# Patient Record
Sex: Male | Born: 1970 | Race: White | Hispanic: No | Marital: Married | Smoking: Current every day smoker
Health system: Southern US, Community
[De-identification: ages and names within clinical notes are randomized; demographics above are authoritative.]

## PROBLEM LIST (undated history)

## (undated) DIAGNOSIS — M1990 Unspecified osteoarthritis, unspecified site: Secondary | ICD-10-CM

## (undated) HISTORY — PX: WISDOM TOOTH EXTRACTION: SHX21

---

## 1994-01-30 HISTORY — PX: FRACTURE SURGERY: SHX138

## 2001-01-30 HISTORY — PX: OTHER SURGICAL HISTORY: SHX169

## 2002-01-30 HISTORY — PX: ABDOMINAL SURGERY: SHX537

## 2008-01-31 HISTORY — PX: POLYPECTOMY: SHX149

## 2015-03-05 ENCOUNTER — Encounter (HOSPITAL_COMMUNITY): Payer: Self-pay | Admitting: Emergency Medicine

## 2015-03-05 ENCOUNTER — Emergency Department (HOSPITAL_COMMUNITY)
Admission: EM | Admit: 2015-03-05 | Discharge: 2015-03-05 | Disposition: A | Discharge status: Home / Self Care | Payer: Self-pay | Attending: Emergency Medicine | Admitting: Emergency Medicine

## 2015-03-05 DIAGNOSIS — L03115 Cellulitis of right lower limb: Secondary | ICD-10-CM | POA: Insufficient documentation

## 2015-03-05 DIAGNOSIS — L02415 Cutaneous abscess of right lower limb: Secondary | ICD-10-CM | POA: Insufficient documentation

## 2015-03-05 MED ORDER — LIDOCAINE-EPINEPHRINE (PF) 1 %-1:200000 IJ SOLN
20.0000 mL | Freq: Once | INTRAMUSCULAR | Status: AC
  Administered 2015-03-05: 20 mL via INTRADERMAL
  Filled 2015-03-05: qty 20

## 2015-03-05 MED ORDER — CEPHALEXIN 500 MG PO CAPS: ORAL_CAPSULE | ORAL | Status: DC

## 2015-03-05 MED ORDER — HYDROCODONE-ACETAMINOPHEN 5-325 MG PO TABS: 1.0000 | ORAL_TABLET | Freq: Four times a day (QID) | ORAL | Status: DC | PRN

## 2015-03-05 MED ORDER — HYDROCODONE-ACETAMINOPHEN 5-325 MG PO TABS
1.0000 | ORAL_TABLET | Freq: Once | ORAL | Status: AC
  Administered 2015-03-05: 1 via ORAL
  Filled 2015-03-05: qty 1

## 2015-03-05 MED ORDER — SULFAMETHOXAZOLE-TRIMETHOPRIM 800-160 MG PO TABS: 1.0000 | ORAL_TABLET | Freq: Two times a day (BID) | ORAL | Status: AC

## 2015-03-05 NOTE — Discharge Instructions (Signed)
Continue to apply warm compress over the wound several times daily to help with drainage.  Remove packing in 2 days.  Take antibiotics as prescribed for the full duration.  Return to ER if you notice no improvement.  Avoid driving or operate heavy machinery while taking pain medication as it can cause drowsiness.  Abscess An abscess is an infected area that contains a collection of pus and debris.It can occur in almost any part of the body. An abscess is also known as a furuncle or boil. CAUSES  An abscess occurs when tissue gets infected. This can occur from blockage of oil or sweat glands, infection of hair follicles, or a minor injury to the skin. As the body tries to fight the infection, pus collects in the area and creates pressure under the skin. This pressure causes pain. People with weakened immune systems have difficulty fighting infections and get certain abscesses more often.  SYMPTOMS Usually an abscess develops on the skin and becomes a painful mass that is red, warm, and tender. If the abscess forms under the skin, you may feel a moveable soft area under the skin. Some abscesses break open (rupture) on their own, but most will continue to get worse without care. The infection can spread deeper into the body and eventually into the bloodstream, causing you to feel ill.  DIAGNOSIS  Your caregiver will take your medical history and perform a physical exam. A sample of fluid may also be taken from the abscess to determine what is causing your infection. TREATMENT  Your caregiver may prescribe antibiotic medicines to fight the infection. However, taking antibiotics alone usually does not cure an abscess. Your caregiver may need to make a small cut (incision) in the abscess to drain the pus. In some cases, gauze is packed into the abscess to reduce pain and to continue draining the area. HOME CARE INSTRUCTIONS   Only take over-the-counter or prescription medicines for pain, discomfort, or  fever as directed by your caregiver.  If you were prescribed antibiotics, take them as directed. Finish them even if you start to feel better.  If gauze is used, follow your caregiver's directions for changing the gauze.  To avoid spreading the infection:  Keep your draining abscess covered with a bandage.  Wash your hands well.  Do not share personal care items, towels, or whirlpools with others.  Avoid skin contact with others.  Keep your skin and clothes clean around the abscess.  Keep all follow-up appointments as directed by your caregiver. SEEK MEDICAL CARE IF:   You have increased pain, swelling, redness, fluid drainage, or bleeding.  You have muscle aches, chills, or a general ill feeling.  You have a fever. MAKE SURE YOU:   Understand these instructions.  Will watch your condition.  Will get help right away if you are not doing well or get worse.   This information is not intended to replace advice given to you by your health care provider. Make sure you discuss any questions you have with your health care provider.   Document Released: 10/26/2004 Document Revised: 07/18/2011 Document Reviewed: 03/31/2011 Elsevier Interactive Patient Education 2016 [employer]  Cellulitis Cellulitis is an infection of the skin and the tissue under the skin. The infected area is usually red and tender. This happens most often in the arms and lower legs. HOME CARE   Take your antibiotic medicine as told. Finish the medicine even if you start to feel better.  Keep the infected arm or  leg raised (elevated).  Put a warm cloth on the area up to 4 times per day.  Only take medicines as told by your doctor.  Keep all doctor visits as told. GET HELP IF:  You see red streaks on the skin coming from the infected area.  Your red area gets bigger or turns a dark color.  Your bone or joint under the infected area is painful after the skin heals.  Your infection comes back in  the same area or different area.  You have a puffy (swollen) bump in the infected area.  You have new symptoms.  You have a fever. GET HELP RIGHT AWAY IF:   You feel very sleepy.  You throw up (vomit) or have watery poop (diarrhea).  You feel sick and have muscle aches and pains.   This information is not intended to replace advice given to you by your health care provider. Make sure you discuss any questions you have with your health care provider.   Document Released: 07/05/2007 Document Revised: 10/07/2014 Document Reviewed: 04/03/2011 Elsevier Interactive Patient Education [employer]

## 2015-03-05 NOTE — ED Notes (Signed)
Pt reports was bitten by something on Tuesday. Pt reports intermittent fevers. Moderate red raised area noted to posterior right calf. Moderate redness noted around site. Black discoloration to top of site. Pt denies any n/v/chills.

## 2015-03-05 NOTE — ED Provider Notes (Signed)
CSN: [id number]     Arrival date & time 03/05/15  1348 History   None    Chief Complaint  Patient presents with  . Insect Bite     (Consider location/radiation/quality/duration/timing/severity/associated sxs/prior Treatment) HPI   45 year old male who presents for evaluation of a possible insect bite to R posterior calf.  Patient report 4 days ago he was walking his dog through the woods.  That night while showering he noticed a small sore area in the back of his right posterior calf. He did attempt to squeeze on it in the next day he noticed increasing pain and swelling. Pain has been persistent along with swelling and redness. Describe pain as a sharp aching sensation worsening with movement and palpation. He has tried taking over-the-counter pain medication at home with minimal relief. He is up-to-date with tetanus. He denies having any fever or numbness.  History reviewed. No pertinent past medical history. History reviewed. No pertinent past surgical history. History reviewed. No pertinent family history. Social History  Substance Use Topics  . Smoking status: Never Smoker   . Smokeless tobacco: None  . Alcohol Use: No    Review of Systems  Constitutional: Negative for fever.  Skin: Positive for rash.      Allergies  Almond (diagnostic) and Valium  Home Medications   Prior to Admission medications   Not on File   BP 132/62 mmHg  Pulse 74  Temp(Src) 98.1 F (36.7 C) (Oral)  Resp 18  Ht  (1.803 m)  Wt 81.647 kg  BMI 25.12 kg/m2  SpO2 98% Physical Exam  Constitutional: He appears well-developed and well-nourished. No distress.  HENT:  Head: Atraumatic.  Eyes: Conjunctivae are normal.  Neck: Neck supple.  Neurological: He is alert.  Skin:  Right posterior calf: An area of induration with fluctuance and surrounding skin erythema approximately 3 cm in diameter, exquisitely tender to palpation with small scab and small amount of necrotic tissue on top. No  lymphangitis. No joint involvement.  Psychiatric: He has a normal mood and affect.  Nursing note and vitals reviewed.   ED Course  Procedures (including critical care time)  INCISION AND DRAINAGE Performed by: Fayrene Helper Consent: Verbal consent obtained. Risks and benefits: risks, benefits and alternatives were discussed Type: abscess  Body area: R posterior calf  Anesthesia: local infiltration  Incision was made with a scalpel.  Local anesthetic: lidocaine 2% w epinephrine  Anesthetic total: 5 ml  Complexity: complex Blunt dissection to break up loculations  Drainage: purulent  Drainage amount: small  Packing material: 1/4 in iodoform gauze  Patient tolerance: Patient tolerated the procedure well with no immediate complications.     MDM   Final diagnoses:  Abscess of right leg  Cellulitis of right leg    BP 132/62 mmHg  Pulse 74  Temp(Src) 98.1 F (36.7 C) (Oral)  Resp 18  Ht  (1.803 m)  Wt 81.647 kg  BMI 25.12 kg/m2  SpO2 98%   3:35 PM patient presents with a possible insect bite forming an abscess to his right posterior calf amenable for incision and drainage. He does have cellulitic skin changes as well and he will need antibiotic. He is up-to-date with tetanus. Pain medication given.  4:00 PM Successful incision and drainage with moderate pustular discharge. Packing is placed. I discussed patient wound care including warm compress, taking antibiotic for the full duration, taking pain medication but avoid driving and to return if symptoms worsen. Patient voiced understanding and agrees with  plan.   Fayrene Helper, PA-C 03/05/15 1600  Samuel Jester, DO 03/07/15 2119

## 2015-04-27 ENCOUNTER — Emergency Department (HOSPITAL_COMMUNITY): Payer: Self-pay

## 2015-04-27 ENCOUNTER — Encounter (HOSPITAL_COMMUNITY): Payer: Self-pay | Admitting: Emergency Medicine

## 2015-04-27 ENCOUNTER — Emergency Department (HOSPITAL_COMMUNITY)
Admission: EM | Admit: 2015-04-27 | Discharge: 2015-04-27 | Disposition: A | Discharge status: Home / Self Care | Payer: Self-pay | Attending: Emergency Medicine | Admitting: Emergency Medicine

## 2015-04-27 DIAGNOSIS — S43402A Unspecified sprain of left shoulder joint, initial encounter: Secondary | ICD-10-CM | POA: Insufficient documentation

## 2015-04-27 DIAGNOSIS — Y999 Unspecified external cause status: Secondary | ICD-10-CM | POA: Insufficient documentation

## 2015-04-27 DIAGNOSIS — Y929 Unspecified place or not applicable: Secondary | ICD-10-CM | POA: Insufficient documentation

## 2015-04-27 DIAGNOSIS — Y939 Activity, unspecified: Secondary | ICD-10-CM | POA: Insufficient documentation

## 2015-04-27 DIAGNOSIS — W19XXXA Unspecified fall, initial encounter: Secondary | ICD-10-CM | POA: Insufficient documentation

## 2015-04-27 MED ORDER — OXYCODONE-ACETAMINOPHEN 5-325 MG PO TABS
1.0000 | ORAL_TABLET | Freq: Three times a day (TID) | ORAL | Status: DC | PRN
Start: 2015-04-27 — End: 2015-09-30

## 2015-04-27 MED ORDER — HYDROMORPHONE HCL 1 MG/ML IJ SOLN
1.0000 mg | Freq: Once | INTRAMUSCULAR | Status: AC
  Administered 2015-04-27: 1 mg via INTRAVENOUS
  Filled 2015-04-27: qty 1

## 2015-04-27 MED ORDER — IBUPROFEN 600 MG PO TABS: 600.0000 mg | ORAL_TABLET | Freq: Four times a day (QID) | ORAL | Status: DC | PRN

## 2015-04-27 NOTE — ED Provider Notes (Signed)
CSN: [id number]     Arrival date & time 04/27/15  1117 History   First MD Initiated Contact with Patient 04/27/15 1206     Chief Complaint  Patient presents with  . Shoulder Injury      Patient is a 45 y.o. male presenting with shoulder injury. The history is provided by the patient.  Shoulder Injury This is a new problem. Episode onset: just prior to arrival. The problem occurs constantly. The problem has been gradually worsening. Pertinent negatives include no chest pain, no abdominal pain and no headaches. Exacerbated by: movement. The symptoms are relieved by rest.  pt reports he tripped and fell and landed on left shoulder and he thinks he dislocated the shoulder He reports long h/o shoulder issues including previous dislocations and he has had surgery on left clavicle previously No head injury No LOC No neck pain No other pain complaints    PMH -none surg hx- left clavicle surgery Social History  Substance Use Topics  . Smoking status: Never Smoker   . Smokeless tobacco: None  . Alcohol Use: No    Review of Systems  Constitutional: Negative for fever.  Cardiovascular: Negative for chest pain.  Gastrointestinal: Negative for vomiting and abdominal pain.  Musculoskeletal: Positive for arthralgias.  Neurological: Negative for headaches.  All other systems reviewed and are negative.     Allergies  Almond (diagnostic) and Valium  Home Medications   Prior to Admission medications   Not on File   BP 151/79 mmHg  Pulse 73  Temp(Src) 98.8 F (37.1 C) (Oral)  Resp 17  Ht  (1.803 m)  Wt 83.915 kg  BMI 25.81 kg/m2  SpO2 100% Physical Exam CONSTITUTIONAL: Well developed/well nourished HEAD: Normocephalic/atraumatic EYES: EOMI ENMT: Mucous membranes moist NECK: supple no meningeal signs SPINE/BACK:entire spine nontender CV: S1/S2 noted, no murmurs/rubs/gallops noted LUNGS: Lungs are clear to auscultation bilaterally, no apparent distress ABDOMEN: soft,  nontender NEURO: Pt is awake/alert/appropriate, moves all extremitiesx4.  No facial droop.   EXTREMITIES: pulses normal/equal, full ROM, tenderness to left shoulder but no obvious deformity.  Swelling noted to left clavicle but no significant skin tenting, no lacerations/abrasions noted No other tenderness to left wrist/hand.   SKIN: warm, color normal PSYCH: no abnormalities of mood noted, alert and oriented to situation  ED Course  Procedures  SPLINT APPLICATION Date/Time: 1:49 PM Authorized by: Joya Gaskins Consent: Verbal consent obtained. Risks and benefits: risks, benefits and alternatives were discussed Consent given by: patient Splint applied by: nurse Location details: left arm Splint type: sling Supplies used: sling Post-procedure: The splinted body part was neurovascularly unchanged following the procedure. Patient tolerance: Patient tolerated the procedure well with no immediate complications.    Imaging Review Dg Shoulder Left  04/27/2015  CLINICAL DATA:  Left shoulder pain after fall the or. Initial encounter. EXAM: LEFT SHOULDER - 2+ VIEW COMPARISON:  None. FINDINGS: Remote distal clavicle fracture versus acromioclavicular separation status post repair. The screw with washer has minimal to no purchase in the scapula. There is a neighboring soft tissue anchor which also appears displaced. Advanced glenohumeral osteoarthritis with bulky inferior spurring. No axillary view was obtained to assess narrowing. No glenohumeral dislocation or fracture. Anterior glenohumeral 2 cm articular body. IMPRESSION: 1. Failed repair of remote distal clavicle fracture/acromioclavicular separation. 2. Advanced glenohumeral osteoarthritis. Electronically Signed   By: Marnee Spring M.D.   On: 04/27/2015 13:04   I have personally reviewed and evaluated these images results as part of my medical decision-making.  1:49 PM I discussed xray imaging with radiology dr watts No acute  fx/dislocation and it appears hardware from previous surgery is not acutely injured Pt reports he last [place]rs [place] 2:47 PM I attempted to arrange ortho f/u, but I was never able to contact orthopedics and pt insisted on leaving due to childcare issues Will go home in arm sling, short course of pain meds and f/u with ortho and he will call tomorrow  Medications  HYDROmorphone (DILAUDID) injection 1 mg (1 mg Intravenous Given 04/27/15 1247)  HYDROmorphone (DILAUDID) injection 1 mg (1 mg Intravenous Given 04/27/15 1409)     MDM   Final diagnoses:  Shoulder sprain, left, initial encounter    Nursing notes including past medical history and social history reviewed and considered in documentation xrays/imaging reviewed by myself and considered during evaluation Narcotic database reviewed and considered in decision making     Zadie Rhineonald Price [place], MD 04/27/15 1448

## 2015-04-27 NOTE — ED Notes (Signed)
Patient with no complaints at this time. Respirations even and unlabored. Skin warm/dry. Discharge instructions reviewed with patient at this time. Patient given opportunity to voice concerns/ask questions. Patient discharged at this time and left Emergency Department with steady gait.   

## 2015-04-27 NOTE — Discharge Instructions (Signed)
Shoulder Sprain °A shoulder sprain is a partial or complete tear in one of the tough, fiber-like tissues (ligaments) in the shoulder. The ligaments in the shoulder help to hold the shoulder in place. °CAUSES °This condition may be caused by: °· A fall. °· A hit to the shoulder. °· A twist of the arm. °RISK FACTORS °This condition is more likely to develop in: °· People who play sports. °· People who have problems with balance or coordination. °SYMPTOMS °Symptoms of this condition include: °· Pain when moving the shoulder. °· Limited ability to move the shoulder. °· Swelling and tenderness on top of the shoulder. °· Warmth in the shoulder. °· A change in the shape of the shoulder. °· Redness or bruising on the shoulder. °DIAGNOSIS °This condition is diagnosed with a physical exam. During the exam, you may be asked to do simple exercises with your shoulder. You may also have imaging tests, such as X-rays, MRI, or a CT scan. These tests can show how severe the sprain is. °TREATMENT °This condition may be treated with: °· Rest. °· Pain medicine. °· Ice. °· A sling or brace. This is used to keep the arm still while the shoulder is healing. °· Physical therapy or rehabilitation exercises. These help to improve the range of motion and strength of the shoulder. °· Surgery (rare). Surgery may be needed if the sprain caused a joint to become unstable. Surgery may also be needed to reduce pain. °Some people may develop ongoing shoulder pain or lose some range of motion in the shoulder. However, most people do not develop long-term problems. °HOME CARE INSTRUCTIONS °· Rest. °· Take over-the-counter and prescription medicines only as told by your health care provider. °· If directed, apply ice to the area: °¨ Put ice in a plastic bag. °¨ Place a towel between your skin and the bag. °¨ Leave the ice on for 20 minutes, 2-3 times per day. °· If you were given a shoulder sling or brace: °¨ Wear it as told. °¨ Remove it to shower or  bathe. °¨ Move your arm only as much as told by your health care provider, but keep your hand moving to prevent swelling. °· If you were shown how to do any exercises, do them as told by your health care provider. °· Keep all follow-up visits as told by your health care provider. This is important. °SEEK MEDICAL CARE IF: °· Your pain gets worse. °· Your pain is not relieved with medicines. °· You have increased redness or swelling. °SEEK IMMEDIATE MEDICAL CARE IF: °· You have a fever. °· You cannot move your arm or shoulder. °· You develop numbness or tingling in your arms, hands, or fingers. °  °This information is not intended to replace advice given to you by your health care provider. Make sure you discuss any questions you have with your health care provider. °  °Document Released: 06/04/2008 Document Revised: 10/07/2014 Document Reviewed: 05/11/2014 °Elsevier Interactive Patient Education ©2016 [employer] Inc[employer] ° °

## 2015-04-27 NOTE — ED Notes (Signed)
Dislocated left shoulder after trying to break a fall.  Rates pain 10/10.

## 2015-06-27 ENCOUNTER — Emergency Department (HOSPITAL_COMMUNITY)
Admission: EM | Admit: 2015-06-27 | Discharge: 2015-06-27 | Disposition: A | Discharge status: Home / Self Care | Payer: Self-pay | Attending: Emergency Medicine | Admitting: Emergency Medicine

## 2015-06-27 ENCOUNTER — Emergency Department (HOSPITAL_COMMUNITY): Payer: Self-pay

## 2015-06-27 ENCOUNTER — Encounter (HOSPITAL_COMMUNITY): Payer: Self-pay | Admitting: Emergency Medicine

## 2015-06-27 DIAGNOSIS — R51 Headache: Secondary | ICD-10-CM | POA: Insufficient documentation

## 2015-06-27 DIAGNOSIS — Y9234 Swimming pool (public) as the place of occurrence of the external cause: Secondary | ICD-10-CM | POA: Insufficient documentation

## 2015-06-27 DIAGNOSIS — W16522A Jumping or diving into swimming pool striking bottom causing other injury, initial encounter: Secondary | ICD-10-CM | POA: Insufficient documentation

## 2015-06-27 DIAGNOSIS — Y9315 Activity, underwater diving and snorkeling: Secondary | ICD-10-CM | POA: Insufficient documentation

## 2015-06-27 DIAGNOSIS — S0181XA Laceration without foreign body of other part of head, initial encounter: Secondary | ICD-10-CM | POA: Insufficient documentation

## 2015-06-27 DIAGNOSIS — Y999 Unspecified external cause status: Secondary | ICD-10-CM | POA: Insufficient documentation

## 2015-06-27 MED ORDER — HYDROCODONE-ACETAMINOPHEN 5-325 MG PO TABS: ORAL_TABLET | ORAL | Status: DC

## 2015-06-27 MED ORDER — LIDOCAINE HCL (PF) 2 % IJ SOLN
10.0000 mL | Freq: Once | INTRAMUSCULAR | Status: AC
  Administered 2015-06-27: 10 mL via INTRADERMAL
  Filled 2015-06-27: qty 10

## 2015-06-27 MED ORDER — OXYCODONE-ACETAMINOPHEN 5-325 MG PO TABS
1.0000 | ORAL_TABLET | Freq: Once | ORAL | Status: AC
  Administered 2015-06-27: 1 via ORAL
  Filled 2015-06-27: qty 1
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
## 2015-06-27 NOTE — Discharge Instructions (Signed)
Laceration Care, Adult  A laceration is a cut that goes through all layers of the skin. The cut also goes into the tissue that is right under the skin. Some cuts heal on their own. Others need to be closed with stitches (sutures), staples, skin adhesive strips, or wound glue. Taking care of your cut lowers your risk of infection and helps your cut to heal better.  HOW TO TAKE CARE OF YOUR CUT  For stitches or staples:  · Keep the wound clean and dry.  · If you were given a bandage (dressing), you should change it at least one time per day or as told by your doctor. You should also change it if it gets wet or dirty.  · Keep the wound completely dry for the first 24 hours or as told by your doctor. After that time, you may take a shower or a bath. However, make sure that the wound is not soaked in water until after the stitches or staples have been removed.  · Clean the wound one time each day or as told by your doctor:    Wash the wound with soap and water.    Rinse the wound with water until all of the soap comes off.    Pat the wound dry with a clean towel. Do not rub the wound.  · After you clean the wound, put a thin layer of antibiotic ointment on it as told by your doctor. This ointment:    Helps to prevent infection.    Keeps the bandage from sticking to the wound.  · Have your stitches or staples removed as told by your doctor.  If your doctor used skin adhesive strips:   · Keep the wound clean and dry.  · If you were given a bandage, you should change it at least one time per day or as told by your doctor. You should also change it if it gets dirty or wet.  · Do not get the skin adhesive strips wet. You can take a shower or a bath, but be careful to keep the wound dry.  · If the wound gets wet, pat it dry with a clean towel. Do not rub the wound.  · Skin adhesive strips fall off on their own. You can trim the strips as the wound heals. Do not remove any strips that are still stuck to the wound. They will  fall off after a while.  If your doctor used wound glue:  · Try to keep your wound dry, but you may briefly wet it in the shower or bath. Do not soak the wound in water, such as by swimming.  · After you take a shower or a bath, gently pat the wound dry with a clean towel. Do not rub the wound.  · Do not do any activities that will make you really sweaty until the skin glue has fallen off on its own.  · Do not apply liquid, cream, or ointment medicine to your wound while the skin glue is still on.  · If you were given a bandage, you should change it at least one time per day or as told by your doctor. You should also change it if it gets dirty or wet.  · If a bandage is placed over the wound, do not let the tape for the bandage touch the skin glue.  · Do not pick at the glue. The skin glue usually stays on for 5-10 days. Then, it   falls off of the skin.  General Instructions   · To help prevent scarring, make sure to cover your wound with sunscreen whenever you are outside after stitches are removed, after adhesive strips are removed, or when wound glue stays in place and the wound is healed. Make sure to wear a sunscreen of at least 30 SPF.  · Take over-the-counter and prescription medicines only as told by your doctor.  · If you were given antibiotic medicine or ointment, take or apply it as told by your doctor. Do not stop using the antibiotic even if your wound is getting better.  · Do not scratch or pick at the wound.  · Keep all follow-up visits as told by your doctor. This is important.  · Check your wound every day for signs of infection. Watch for:    Redness, swelling, or pain.    Fluid, blood, or pus.  · Raise (elevate) the injured area above the level of your heart while you are sitting or lying down, if possible.  GET HELP IF:  · You got a tetanus shot and you have any of these problems at the injection site:    Swelling.    Very bad pain.    Redness.    Bleeding.  · You have a fever.  · A wound that was  closed breaks open.  · You notice a bad smell coming from your wound or your bandage.  · You notice something coming out of the wound, such as wood or glass.  · Medicine does not help your pain.  · You have more redness, swelling, or pain at the site of your wound.  · You have fluid, blood, or pus coming from your wound.  · You notice a change in the color of your skin near your wound.  · You need to change the bandage often because fluid, blood, or pus is coming from the wound.  · You start to have a new rash.  · You start to have numbness around the wound.  GET HELP RIGHT AWAY IF:  · You have very bad swelling around the wound.  · Your pain suddenly gets worse and is very bad.  · You notice painful lumps near the wound or on skin that is anywhere on your body.  · You have a red streak going away from your wound.  · The wound is on your hand or foot and you cannot move a finger or toe like you usually can.  · The wound is on your hand or foot and you notice that your fingers or toes look pale or bluish.     This information is not intended to replace advice given to you by your health care provider. Make sure you discuss any questions you have with your health care provider.     Document Released: 07/05/2007 Document Revised: 06/02/2014 Document Reviewed: 01/12/2014  Elsevier Interactive Patient Education ©2016 [employer]
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
## 2015-07-20 ENCOUNTER — Emergency Department (HOSPITAL_COMMUNITY)
Admission: EM | Admit: 2015-07-20 | Discharge: 2015-07-20 | Disposition: A | Discharge status: Home / Self Care | Payer: Self-pay | Attending: Emergency Medicine | Admitting: Emergency Medicine

## 2015-07-20 ENCOUNTER — Encounter (HOSPITAL_COMMUNITY): Payer: Self-pay | Admitting: *Deleted

## 2015-07-20 DIAGNOSIS — Y990 Civilian activity done for income or pay: Secondary | ICD-10-CM | POA: Insufficient documentation

## 2015-07-20 DIAGNOSIS — Y929 Unspecified place or not applicable: Secondary | ICD-10-CM | POA: Insufficient documentation

## 2015-07-20 DIAGNOSIS — S86812A Strain of other muscle(s) and tendon(s) at lower leg level, left leg, initial encounter: Secondary | ICD-10-CM | POA: Insufficient documentation

## 2015-07-20 DIAGNOSIS — X58XXXA Exposure to other specified factors, initial encounter: Secondary | ICD-10-CM | POA: Insufficient documentation

## 2015-07-20 DIAGNOSIS — S86119A Strain of other muscle(s) and tendon(s) of posterior muscle group at lower leg level, unspecified leg, initial encounter: Secondary | ICD-10-CM

## 2015-07-20 DIAGNOSIS — Y939 Activity, unspecified: Secondary | ICD-10-CM | POA: Insufficient documentation

## 2015-07-20 MED ORDER — NAPROXEN 250 MG PO TABS
500.0000 mg | ORAL_TABLET | Freq: Once | ORAL | Status: AC
  Administered 2015-07-20: 500 mg via ORAL
  Filled 2015-07-20: qty 2

## 2015-07-20 MED ORDER — NAPROXEN 500 MG PO TABS: 500.0000 mg | ORAL_TABLET | Freq: Two times a day (BID) | ORAL | Status: DC

## 2015-07-20 NOTE — ED Notes (Signed)
Pt states sharp pain in left calf while lifting up onto his toes.

## 2015-07-20 NOTE — ED Provider Notes (Signed)
CSN: [id number]     Arrival date & time 07/20/15  1544 History  First MD Initiated Contact with Patient 07/20/15 1611     Chief Complaint  Patient presents with  . Leg Pain   HPI Patient presents to the emergency room for evaluation of sharp pain in his left  calf.  Patient states he was at work the other day when he went to stand up on his toes he immediately felt a sharp pain in the back of his calf. Patient was able to continue to walk however the pain has increased. It hurts to put any weight. The pain is very sharp. he's noticed bruising and swelling now in his ankle. He denies any numbness or weakness. No chest pain or shortness of breath.  History reviewed. No pertinent past medical history. History reviewed. No pertinent past surgical history. History reviewed. No pertinent family history. Social History  Substance Use Topics  . Smoking status: Never Smoker   . Smokeless tobacco: None  . Alcohol Use: No    Review of Systems  All other systems reviewed and are negative.     Allergies  Almond (diagnostic) and Valium  Home Medications   Prior to Admission medications   Medication Sig Start Date End Date Taking? Authorizing Provider  HYDROcodone-acetaminophen (NORCO/VICODIN) 5-325 MG tablet Take one-two tabs po q 4-6 hrs prn pain 06/27/15   Tammy Triplett, PA-C  ibuprofen (ADVIL,MOTRIN) 600 MG tablet Take 1 tablet (600 mg total) by mouth every 6 (six) hours as needed. 04/27/15   Zadie Rhineonald Wickline, MD  oxyCODONE-acetaminophen (PERCOCET/ROXICET) 5-325 MG tablet Take 1 tablet by mouth every 8 (eight) hours as needed for severe pain. 04/27/15   Zadie Rhineonald Wickline, MD   BP 122/62 mmHg  Pulse 79  Temp(Src) 98.9 F (37.2 C) (Oral)  Resp 16  Ht 5\' 10"  (1.778 m)  Wt 83.915 kg  BMI 26.54 kg/m2  SpO2 98% Physical Exam  Constitutional: He appears well-developed and well-nourished. No distress.  HENT:  Head: Normocephalic and atraumatic.  Right Ear: External ear normal.  Left Ear:  External ear normal.  Eyes: Conjunctivae are normal. Right eye exhibits no discharge. Left eye exhibits no discharge. No scleral icterus.  Neck: Neck supple. No tracheal deviation present.  Cardiovascular: Normal rate.   Pulmonary/Chest: Effort normal. No stridor. No respiratory distress.  Musculoskeletal: He exhibits edema and tenderness.       Left ankle: Achilles tendon normal. Achilles tendon exhibits no defect and normal Thompson's test results.       Right lower leg: Normal. He exhibits no bony tenderness, no deformity and no laceration.       Left lower leg: He exhibits tenderness and swelling. He exhibits no bony tenderness.  Edema and tenderness of the  Left calf , ecchymosis noted inferiorly around the ankle, no tenderness to palpation along the medial malleolus or lateral malleolus. No tenderness to palpation of the tibia  Patient is able to plantarflex and dorsiflex, negative Thompson test  Neurological: He is alert. Cranial nerve deficit: no gross deficits.  Skin: Skin is warm and dry. No rash noted.  Psychiatric: He has a normal mood and affect.  Nursing note and vitals reviewed.   ED Course  Procedures (including critical care time)   MDM   Final diagnoses:  Gastrocnemius strain, unspecified laterality, initial encounter   Patient's exam is consistent with a gastrocnemius strain and tear. I doubt Achilles rupture. Plan on crutches, nonweightbearing and outpatient follow-up with orthopedics.  Linwood Dibbles, MD 07/20/15 1650

## 2015-07-20 NOTE — Discharge Instructions (Signed)
Medial Head Gastrocnemius Tear With Rehab °Medial head gastrocnemius tear, also called tennis leg, is a tear (strain) in a muscle or tendon of the inner portion (medial head) of one of the calf muscles (gastrocnemius). The inner portion of the calf muscle attaches to the thigh bone (femur) and is responsible for bending the knee and straightening the foot (standing "on tiptoe"). Strains are classified into three categories. Grade 1 strains cause pain, but the tendon is not lengthened. Grade 2 strains include a lengthened ligament, due to the ligament being stretched or partially ruptured. With grade 2 strains there is still function, although function may be decreased. Grade 3 strains involve a complete tear of the tendon or muscle, and function is usually impaired. °SYMPTOMS  °· Sudden "pop" or tear felt at the time of injury. °· Pain, tenderness, swelling, warmth, or redness over the middle inner calf. °· Pain and weakness with ankle motion, especially flexing the ankle against resistance, as well as pain with lifting up the foot (extending the ankle). °· Bruising (contusion) of the calf, heel, and sometimes the foot within 48 hours of injury. °· Muscle spasm in the calf. °CAUSES  °Muscle and ligament strains occur when a force is placed on the muscle or ligament that is greater than it can handle. Common causes of injury include: °· Direct hit (trauma) to the calf. °· Sudden forceful pushing off or landing on the foot (jumping, landing, serving a tennis ball, lunging). °RISK INCREASES WITH: °· Sports that require sudden, explosive calf muscle contraction, such as those involving jumping (basketball), hill running, quick starts (running), or lunging (racquetball, tennis). °· Contact sports (football, soccer, hockey). °· Poor strength and flexibility. °· Previous lower limb injury. °PREVENTION °· Warm up and stretch properly before activity. °· Allow for adequate recovery between workouts. °· Maintain physical  fitness: °¨ Strength, flexibility, and endurance. °¨ Cardiovascular fitness. °· Learn and use proper exercise technique. °· Complete rehabilitation after lower limb injury, before returning to competition or practice. °PROGNOSIS  °If treated properly, tennis leg usually heals within 6 weeks of nonsurgical treatment.  °RELATED COMPLICATIONS  °· Longer healing time, if not properly treated or if not given enough time to heal. °· Recurring symptoms and injury, if activity is resumed too soon, with overuse, with a direct blow, or with poor technique. °· If untreated, may progress to a complete tear (rare) or other injury, due to limping and favoring of the injured leg. °· Persistent limping, due to scarring and shortening of the calf muscles, as a result of inadequate rehabilitation. °· Prolonged disability. °TREATMENT  °Treatment first involves the use of ice and medication to help reduce pain and inflammation. The use of strengthening and stretching exercises may help reduce pain with activity. These exercises may be performed at home or with a therapist. For severe injuries, referral to a therapist may be needed for further evaluation and treatment. Your caregiver may advise that you wear a brace to help healing. Sometimes, crutches are needed until you can walk without limping. Rarely, surgery is needed.  °MEDICATION  °· If pain medicine is needed, nonsteroidal anti-inflammatory medicines (aspirin and ibuprofen), or other minor pain relievers (acetaminophen), are often advised. °· Do not take pain medicine for 7 days before surgery. °· Prescription pain relievers may be given, if your caregiver thinks they are needed. Use only as directed and only as much as you need. °HEAT AND COLD °· Cold treatment (icing) should be applied for 10 to 15 minutes every   2 to 3 hours for inflammation and pain, and immediately after activity that aggravates your symptoms. Use ice packs or an ice massage. °· Heat treatment may be used  before performing stretching and strengthening activities prescribed by your caregiver, physical therapist, or athletic trainer. Use a heat pack or a warm water soak. °SEEK MEDICAL CARE IF:  °· Symptoms get worse or do not improve in 2 weeks, despite treatment. °· Numbness or tingling develops. °· New, unexplained symptoms develop. (Drugs used in treatment may produce side effects.) °EXERCISES  °RANGE OF MOTION (ROM) AND STRETCHING EXERCISES - Medial Head Gastrocnemius Tear (Tennis Leg) °These exercises may help you when beginning to rehabilitate your injury. Your symptoms may resolve with or without further involvement from your physician, physical therapist, or athletic trainer. While completing these exercises, remember:  °· Restoring tissue flexibility helps normal motion to return to the joints. This allows healthier, less painful movement and activity. °· An effective stretch should be held for at least 30 seconds. °· A stretch should never be painful. You should only feel a gentle lengthening or release in the stretched tissue. °STRETCH - Gastrocsoleus °· Sit with your right / left leg extended. Holding onto both ends of a belt or towel, loop it around the ball of your foot. °· Keeping your right / left ankle and foot relaxed and your knee straight, pull your foot and ankle toward you using the belt. °· You should feel a gentle stretch behind your calf or knee. Hold this position for __________ seconds. °Repeat __________ times. Complete this stretch __________ times per day.  °RANGE OF MOTION - Ankle Dorsiflexion, Active Assisted  °· Remove your shoes and sit on a chair, preferably not on a carpeted surface. °· Place your right / left foot directly under the knee. Extend your opposite leg for support. °· Keeping your heel down, slide your right / left foot back toward the chair, until you feel a stretch at your ankle or calf. If you do not feel a stretch, slide your bottom forward to the edge of the chair,  while still keeping your heel down. °· Hold this stretch for __________ seconds. °Repeat __________ times. Complete this stretch __________ times per day.  °STRETCH - Gastroc, Standing  °· Place your hands on a wall. °· Extend your right / left leg behind you, keeping the front knee somewhat bent. °· Slightly point your toes inward on your back foot. °· Keeping your right / left heel on the floor and your knee straight, shift your weight toward the wall, not allowing your back to arch. °· You should feel a gentle stretch in the right / left calf. Hold this position for __________ seconds. °Repeat __________ times. Complete this stretch __________ times per day. °STRETCH - Soleus, Standing  °· Place your hands on a wall. °· Extend your right / left leg behind you, keeping the other knee somewhat bent. °· Point your toes of your back foot slightly inward. °· Keep your right / left heel on the floor, bend your back knee, and slightly shift your weight over the back leg so that you feel a gentle stretch deep in your back calf. °· Hold this position for __________ seconds. °Repeat __________ times. Complete this stretch __________ times per day. °STRETCH - Gastrocsoleus, Standing °Note: This exercise can place a lot of stress on your foot and ankle. Please complete this exercise only if specifically instructed by your caregiver.  °· Place the ball of your right /   left foot on a step, keeping your other foot firmly on the same step. °· Hold on to the wall or a rail for balance. °· Slowly lift your other foot, allowing your body weight to press your heel down over the edge of the step. °· You should feel a stretch in your right / left calf. °· Hold this position for __________ seconds. °· Repeat this exercise with a slight bend in your right / left knee. °Repeat __________ times. Complete this stretch __________ times per day.  °STRENGTHENING EXERCISES - Medial Head Gastrocnemius Tear (Tennis Leg) °These exercises may help  you when beginning to rehabilitate your injury. They may resolve your symptoms with or without further involvement from your physician, physical therapist, or athletic trainer. While completing these exercises, remember:  °· Muscles can gain both the endurance and the strength needed for everyday activities through controlled exercises. °· Complete these exercises as instructed by your physician, physical therapist, or athletic trainer. Increase the resistance and repetitions only as guided by your caregiver. °STRENGTH - Plantar-flexors °· Sit with your right / left leg extended. Holding onto both ends of a rubber exercise band or tubing, loop it around the ball of your foot. Keep a slight tension in the band. °· Slowly push your toes away from you, pointing them downward. °· Hold this position for __________ seconds. Return slowly, controlling the tension in the band. °Repeat __________ times. Complete this exercise __________ times per day.  °STRENGTH - Plantar-flexors °· Stand with your feet shoulder width apart. Steady yourself with a wall or table, using as little support as needed. °· Keeping your weight evenly spread over the width of your feet, rise up on your toes.* °· Hold this position for __________ seconds. °Repeat __________ times. Complete this exercise __________ times per day.  °*If this is too easy, shift your weight toward your right / left leg until you feel challenged. Ultimately, you may be asked to do this exercise while standing on your right / left foot only. °STRENGTH - Plantar-flexors, Eccentric °Note: This exercise can place a lot of stress on your foot and ankle. Please complete this exercise only if specifically instructed by your caregiver.  °· Place the balls of your feet on a step. With your hands, use only enough support from a wall or rail to keep your balance. °· Keep your knees straight and rise up on your toes. °· Slowly shift your weight entirely to your right / left toes and  pick up your opposite foot. Gently and with controlled movement, lower your weight through your right / left foot so that your heel drops below the level of the step. You will feel a slight stretch in the back of your right / left calf. °· Use the healthy leg to help rise up onto the balls of both feet, then lower weight only onto the right / left leg again. Build up to 15 repetitions. Then progress to 3 sets of 15 repetitions.* °· After completing the above exercise, complete the same exercise with a slight knee bend (about 30 degrees). Again, build up to 15 repetitions. Then progress to 3 sets of 15 repetitions.* °Perform this exercise __________ times per day.  °*When you easily complete 3 sets of 15, your physician, physical therapist, or athletic trainer may advise you to add resistance, by wearing a backpack filled with additional weight. °  °This information is not intended to replace advice given to you by your health care provider. Make   sure you discuss any questions you have with your health care provider.   Document Released: 01/16/2005 Document Revised: 02/06/2014 Document Reviewed: 04/30/2008 Elsevier Interactive Patient Education 2016 [employer] Muscle Strain A muscle strain is an injury that occurs when a muscle is stretched beyond its normal length. Usually a small number of muscle fibers are torn when this happens. Muscle strain is rated in degrees. First-degree strains have the least amount of muscle fiber tearing and pain. Second-degree and third-degree strains have increasingly more tearing and pain.  Usually, recovery from muscle strain takes 1-2 weeks. Complete healing takes 5-6 weeks.  CAUSES  Muscle strain happens when a sudden, violent force placed on a muscle stretches it too far. This may occur with lifting, sports, or a fall.  RISK FACTORS Muscle strain is especially common in athletes.  SIGNS AND SYMPTOMS At the site of the muscle strain, there may  be:  Pain.  Bruising.  Swelling.  Difficulty using the muscle due to pain or lack of normal function. DIAGNOSIS  Your health care provider will perform a physical exam and ask about your medical history. TREATMENT  Often, the best treatment for a muscle strain is resting, icing, and applying cold compresses to the injured area.  HOME CARE INSTRUCTIONS   Use the PRICE method of treatment to promote muscle healing during the first 2-3 days after your injury. The PRICE method involves:  Protecting the muscle from being injured again.  Restricting your activity and resting the injured body part.  Icing your injury. To do this, put ice in a plastic bag. Place a towel between your skin and the bag. Then, apply the ice and leave it on from 15-20 minutes each hour. After the third day, switch to moist heat packs.  Apply compression to the injured area with a splint or elastic bandage. Be careful not to wrap it too tightly. This may interfere with blood circulation or increase swelling.  Elevate the injured body part above the level of your heart as often as you can.  Only take over-the-counter or prescription medicines for pain, discomfort, or fever as directed by your health care provider.  Warming up prior to exercise helps to prevent future muscle strains. SEEK MEDICAL CARE IF:   You have increasing pain or swelling in the injured area.  You have numbness, tingling, or a significant loss of strength in the injured area. MAKE SURE YOU:   Understand these instructions.  Will watch your condition.  Will get help right away if you are not doing well or get worse.   This information is not intended to replace advice given to you by your health care provider. Make sure you discuss any questions you have with your health care provider.   Document Released: 01/16/2005 Document Revised: 11/06/2012 Document Reviewed: 08/15/2012 Elsevier Interactive Patient Education [employer]2016 [employer]

## 2015-09-30 ENCOUNTER — Emergency Department (HOSPITAL_COMMUNITY): Payer: Self-pay

## 2015-09-30 ENCOUNTER — Emergency Department (HOSPITAL_COMMUNITY)
Admission: EM | Admit: 2015-09-30 | Discharge: 2015-09-30 | Disposition: A | Discharge status: Home / Self Care | Payer: Self-pay | Attending: Emergency Medicine | Admitting: Emergency Medicine

## 2015-09-30 ENCOUNTER — Encounter (HOSPITAL_COMMUNITY): Payer: Self-pay | Admitting: Emergency Medicine

## 2015-09-30 DIAGNOSIS — Y939 Activity, unspecified: Secondary | ICD-10-CM | POA: Insufficient documentation

## 2015-09-30 DIAGNOSIS — Y929 Unspecified place or not applicable: Secondary | ICD-10-CM | POA: Insufficient documentation

## 2015-09-30 DIAGNOSIS — S61219A Laceration without foreign body of unspecified finger without damage to nail, initial encounter: Secondary | ICD-10-CM

## 2015-09-30 DIAGNOSIS — F17210 Nicotine dependence, cigarettes, uncomplicated: Secondary | ICD-10-CM | POA: Insufficient documentation

## 2015-09-30 DIAGNOSIS — S61209A Unspecified open wound of unspecified finger without damage to nail, initial encounter: Secondary | ICD-10-CM

## 2015-09-30 DIAGNOSIS — Y990 Civilian activity done for income or pay: Secondary | ICD-10-CM | POA: Insufficient documentation

## 2015-09-30 DIAGNOSIS — W268XXA Contact with other sharp object(s), not elsewhere classified, initial encounter: Secondary | ICD-10-CM | POA: Insufficient documentation

## 2015-09-30 DIAGNOSIS — S61207A Unspecified open wound of left little finger without damage to nail, initial encounter: Secondary | ICD-10-CM | POA: Insufficient documentation

## 2015-09-30 DIAGNOSIS — S56429A Laceration of extensor muscle, fascia and tendon of unspecified finger at forearm level, initial encounter: Secondary | ICD-10-CM

## 2015-09-30 MED ORDER — CEPHALEXIN 500 MG PO CAPS
500.0000 mg | ORAL_CAPSULE | Freq: Once | ORAL | Status: AC
  Administered 2015-09-30: 500 mg via ORAL
  Filled 2015-09-30: qty 1

## 2015-09-30 MED ORDER — HYDROGEN PEROXIDE 3 % EX SOLN
CUTANEOUS | Status: AC
  Filled 2015-09-30: qty 473

## 2015-09-30 MED ORDER — CEPHALEXIN 500 MG PO CAPS: 500.0000 mg | ORAL_CAPSULE | Freq: Four times a day (QID) | ORAL | 0 refills | Status: DC

## 2015-09-30 MED ORDER — LIDOCAINE HCL (PF) 2 % IJ SOLN
INTRAMUSCULAR | Status: AC
  Filled 2015-09-30: qty 10

## 2015-09-30 MED ORDER — POVIDONE-IODINE 10 % EX SOLN
CUTANEOUS | Status: AC
  Administered 2015-09-30: 1
  Filled 2015-09-30: qty 118

## 2015-09-30 MED ORDER — OXYCODONE-ACETAMINOPHEN 5-325 MG PO TABS: 1.0000 | ORAL_TABLET | ORAL | 0 refills | Status: DC | PRN

## 2015-09-30 MED ORDER — BUPIVACAINE HCL (PF) 0.5 % IJ SOLN
10.0000 mL | Freq: Once | INTRAMUSCULAR | Status: AC
  Administered 2015-09-30: 10 mL
  Filled 2015-09-30: qty 30

## 2015-09-30 MED ORDER — HYDROCODONE-ACETAMINOPHEN 5-325 MG PO TABS
1.0000 | ORAL_TABLET | Freq: Once | ORAL | Status: AC
  Administered 2015-09-30: 1 via ORAL
  Filled 2015-09-30: qty 1

## 2015-09-30 NOTE — ED Triage Notes (Signed)
Pt cut his LT pinky finger while at work with a carbon disk. Bleeding controlled. Pt reports that he is unable to extend finger all of the way.

## 2015-09-30 NOTE — Discharge Instructions (Addendum)
Keep your finger clean, dry and covered.  Check it daily  starting on Saturday for any signs of infection, then apply a new dressing and replace the splint.  Take the entire course of the antibiotic.  You may take the oxycodone prescribed for pain relief.  This will make you drowsy - do not drive within 4 hours of taking this medication.

## 2015-09-30 NOTE — ED Notes (Signed)
Pt has laceration on top of left pinkie finger and most distal joint.  Tip of finger flexed and exposed/severed extensor tendon.  No bleeding noted.

## 2015-09-30 NOTE — ED Provider Notes (Signed)
[facility]-EMERGENCY DEPT Provider Note   CSN: [id number] Arrival date & time: 09/30/15  1430     History   Chief Complaint Chief Complaint  Patient presents with  . Laceration    HPI Kyle Ponce is a 45 y.o. male, ambidextrous, presenting with left 5th finger laceration obtained when he cut it on a moving carbon disk while at work.  He reports pain and decreased sensation in the distal fingertip and is unable to extend the distal tip since the injury.  He reports the wound bled minimally but the disk that caused the injury was very hot, suspecting somewhat cauterized the wound edges.  He is utd with his tetanus vaccine.  He has had no treatment prior to arrival. .  The history is provided by the patient.  Laceration      History reviewed. No pertinent past medical history.  There are no active problems to display for this patient.   History reviewed. No pertinent surgical history.     Home Medications    Prior to Admission medications   Medication Sig Start Date End Date Taking? Authorizing Provider  cephALEXin (KEFLEX) 500 MG capsule Take 1 capsule (500 mg total) by mouth 4 (four) times daily. 09/30/15   Burgess Amor, PA-C  HYDROcodone-acetaminophen (NORCO/VICODIN) 5-325 MG tablet Take one-two tabs po q 4-6 hrs prn pain Patient not taking: Reported on 07/20/2015 06/27/15   Tammy Triplett, PA-C  ibuprofen (ADVIL,MOTRIN) 600 MG tablet Take 1 tablet (600 mg total) by mouth every 6 (six) hours as needed. Patient not taking: Reported on 07/20/2015 04/27/15   Zadie Rhine, MD  naproxen (NAPROSYN) 500 MG tablet Take 1 tablet (500 mg total) by mouth 2 (two) times daily. 07/20/15   Linwood Dibbles, MD  NON FORMULARY Take 2 capsules by mouth 2 (two) times daily. PLEXUS    Historical Provider, MD  NON FORMULARY Take 1 packet by mouth daily. *PLEXUS drink mixture    Historical Provider, MD  oxyCODONE-acetaminophen (PERCOCET/ROXICET) 5-325 MG tablet Take 1 tablet by mouth every 4 (four) hours as  needed. 09/30/15   Burgess Amor, PA-C    Family History No family history on file.  Social History Social History  Substance Use Topics  . Smoking status: Current Every Day Smoker    Types: Cigars  . Smokeless tobacco: Never Used     Comment: smokes black and milds (1/day)  . Alcohol use No     Allergies   Almond (diagnostic) and Valium [diazepam]   Review of Systems Review of Systems  Constitutional: Negative for chills and fever.  HENT: Negative.   Respiratory: Negative.   Cardiovascular: Negative.   Gastrointestinal: Negative.   Musculoskeletal: Positive for arthralgias.  Skin: Positive for wound.  Neurological: Positive for numbness.     Physical Exam Updated Vital Signs BP 138/85 (BP Location: Left Arm)   Pulse 109   Temp 99.1 F (37.3 C) (Oral)   Resp 18   Ht 5\' 10"  (1.778 m)   Wt 80.7 kg   SpO2 97%   BMI 25.54 kg/m   Physical Exam  Constitutional: He appears well-developed and well-nourished.  HENT:  Head: Atraumatic.  Neck: Normal range of motion.  Cardiovascular:  Pulses equal bilaterally  Musculoskeletal: He exhibits tenderness.  Neurological: He is alert. He has normal strength. He displays normal reflexes. No sensory deficit.  Skin: Skin is warm and dry. Laceration noted.  1.5 cm laceration dorsal linear deep laceration, with visualization of joint capsule. Cannot locate the tendon edges, but  distal phalanx is held in flexion and pt is unable to extend the distal phalanx.  Proximal phalanx FROM.  Distal finger tip numb.  Nail plate intact.   Hemostatic.  Psychiatric: He has a normal mood and affect.     ED Treatments / Results  Labs (all labs ordered are listed, but only abnormal results are displayed) Labs Reviewed - No data to display  EKG  EKG Interpretation None       Radiology Dg Finger Little Left  Result Date: 09/30/2015 CLINICAL DATA:  Laceration to top of fifth digit. EXAM: LEFT LITTLE FINGER 2+V COMPARISON:  None.  FINDINGS: Soft tissue injury is identified about the distal fifth digit, at the level of the distal interphalangeal joint. No acute fracture or dislocation. No radiopaque foreign object. The DIP joint is in flexion throughout. IMPRESSION: Soft tissue injury about the distal fifth digit. No osseous abnormality identified. joint is in flexion throughout. This could be positional, or a chronic finding. Tendinous injury cannot be excluded. Electronically Signed   By: Jeronimo Greaves M.D.   On: 09/30/2015 15:01    Procedures Procedures (including critical care time)  LACERATION REPAIR Performed by: Burgess Amor Authorized by: Burgess Amor Consent: Verbal consent obtained. Risks and benefits: risks, benefits and alternatives were discussed Consent given by: patient Patient identity confirmed: provided demographic data Prepped and Draped in normal sterile fashion Wound explored  Laceration Location: left 5th finger  Laceration Length: 1.5cm  No Foreign Bodies seen or palpated  Anesthesia: digital block  Local anesthetic: lidocaine 2% without epinephrine  Anesthetic total: 2 ml  Irrigation method: syringe Amount of cleaning: standard  Skin closure: ethilon 4-0  Number of sutures: #3, loosely approximated  Technique: simple interrupted  Patient tolerance: Patient tolerated the procedure well with no immediate complications.  16:55 - still awaiting call back from Dr. Izora Ribas.  Pt's lidocaine digital block has resolved.  Re-blocked using marcaine 0.5%  2 cc using a new sterile syringe and new 25 gauge needle.  Pt tolerated well.   Medications Ordered in ED Medications  lidocaine (XYLOCAINE) 2 % injection (not administered)  hydrogen peroxide 3 % external solution (  Not Given 09/30/15 1636)  cephALEXin (KEFLEX) capsule 500 mg (not administered)  povidone-iodine (BETADINE) 10 % external solution (1 application  Given 09/30/15 1636)  HYDROcodone-acetaminophen (NORCO/VICODIN) 5-325 MG per  tablet 1 tablet (1 tablet Oral Given 09/30/15 1635)  bupivacaine (MARCAINE) 0.5 % injection 10 mL (10 mLs Infiltration Given 09/30/15 1635)     Initial Impression / Assessment and Plan / ED Course  I have reviewed the triage vital signs and the nursing notes.  Pertinent labs & imaging results that were available during my care of the patient were reviewed by me and considered in my medical decision making (see chart for details).  Clinical Course    Pt with dorsal laceration across dip joint with injury of distal extensor tendon.  Call to Dr. Izora Ribas.  Will see pt in office early next week.  Pt to call for appt.  Keflex, oxycodone, dressing and splint applied.    Final Clinical Impressions(s) / ED Diagnoses   Final diagnoses:  Laceration of finger, initial encounter  Extensor tendon laceration of finger with open wound, initial encounter    New Prescriptions New Prescriptions   CEPHALEXIN (KEFLEX) 500 MG CAPSULE    Take 1 capsule (500 mg total) by mouth 4 (four) times daily.   OXYCODONE-ACETAMINOPHEN (PERCOCET/ROXICET) 5-325 MG TABLET    Take 1 tablet by  mouth every 4 (four) hours as needed.     Burgess AmorJulie Ayerim Berquist, PA-C 09/30/15 1728    Raeford RazorStephen Kohut, MD 10/09/15 1049

## 2016-03-28 ENCOUNTER — Emergency Department (HOSPITAL_COMMUNITY)
Admission: EM | Admit: 2016-03-28 | Discharge: 2016-03-28 | Disposition: A | Discharge status: Home / Self Care | Payer: Self-pay | Attending: Emergency Medicine | Admitting: Emergency Medicine

## 2016-03-28 ENCOUNTER — Encounter (HOSPITAL_COMMUNITY): Payer: Self-pay | Admitting: Emergency Medicine

## 2016-03-28 DIAGNOSIS — Y999 Unspecified external cause status: Secondary | ICD-10-CM | POA: Insufficient documentation

## 2016-03-28 DIAGNOSIS — F17290 Nicotine dependence, other tobacco product, uncomplicated: Secondary | ICD-10-CM | POA: Insufficient documentation

## 2016-03-28 DIAGNOSIS — Y929 Unspecified place or not applicable: Secondary | ICD-10-CM | POA: Insufficient documentation

## 2016-03-28 DIAGNOSIS — W57XXXA Bitten or stung by nonvenomous insect and other nonvenomous arthropods, initial encounter: Secondary | ICD-10-CM | POA: Insufficient documentation

## 2016-03-28 DIAGNOSIS — Y939 Activity, unspecified: Secondary | ICD-10-CM | POA: Insufficient documentation

## 2016-03-28 DIAGNOSIS — L02415 Cutaneous abscess of right lower limb: Secondary | ICD-10-CM | POA: Insufficient documentation

## 2016-03-28 DIAGNOSIS — L0291 Cutaneous abscess, unspecified: Secondary | ICD-10-CM

## 2016-03-28 MED ORDER — SULFAMETHOXAZOLE-TRIMETHOPRIM 800-160 MG PO TABS: 1.0000 | ORAL_TABLET | Freq: Two times a day (BID) | ORAL | 0 refills | Status: DC

## 2016-03-28 MED ORDER — LIDOCAINE HCL (PF) 1 % IJ SOLN
INTRAMUSCULAR | Status: AC
  Filled 2016-03-28: qty 5

## 2016-03-28 MED ORDER — HYDROCODONE-ACETAMINOPHEN 5-325 MG PO TABS
1.0000 | ORAL_TABLET | Freq: Once | ORAL | Status: AC
  Administered 2016-03-28: 1 via ORAL
  Filled 2016-03-28: qty 1

## 2016-03-28 MED ORDER — LIDOCAINE HCL (PF) 1 % IJ SOLN
30.0000 mL | Freq: Once | INTRAMUSCULAR | Status: DC
  Filled 2016-03-28: qty 30

## 2016-03-28 MED ORDER — HYDROCODONE-ACETAMINOPHEN 5-325 MG PO TABS: 1.0000 | ORAL_TABLET | Freq: Four times a day (QID) | ORAL | 0 refills | Status: DC | PRN

## 2016-03-28 NOTE — ED Triage Notes (Signed)
Patient states has been on his knees working and notice a small place that has come up on his right knee. States that the pain has gotten worse over past 4 days. Denies fever/chills. NAD

## 2016-03-28 NOTE — Discharge Instructions (Signed)
Keep the area clean. Take your entire course of antibiotics. Follow up with your PCP for wound recheck in 48 hours.  Return to the emergency department if redness increases, swelling, warmth, you develop a fever or any other concerning symptoms.

## 2016-03-28 NOTE — ED Provider Notes (Signed)
[facility]-EMERGENCY DEPT Provider Note   CSN: [id number] Arrival date & time: 03/28/16  1741     History   Chief Complaint Chief Complaint  Patient presents with  . Insect Bite    HPI Kyle Ponce is a 46 y.o. male with no past medical history presenting with a small area of skin erythema on the right knee and pain. Patient states that about 5 days ago he noticed a small bump that looked like a white pimple. He attempted to squeeze it and after that has been experiencing 4 days of intense pain and worsening erythema. He states that he has been taking 5 ibuprofen every 3-4 hours without relief for the past 4 days. He works on his knees as a Curator and could not put any weight on that knee. He denies any fever, chills, seeing an insect or any other symptoms.  HPI  History reviewed. No pertinent past medical history.  There are no active problems to display for this patient.   History reviewed. No pertinent surgical history.     Home Medications    Prior to Admission medications   Medication Sig Start Date End Date Taking? Authorizing Provider  NON FORMULARY Take 2 capsules by mouth 2 (two) times daily. PLEXUS   Yes Historical Provider, MD  NON FORMULARY Take 1 packet by mouth daily. *PLEXUS drink mixture   Yes Historical Provider, MD  HYDROcodone-acetaminophen (NORCO/VICODIN) 5-325 MG tablet Take 1 tablet by mouth every 6 (six) hours as needed for severe pain. 03/28/16   Georgiana Shore, PA-C  sulfamethoxazole-trimethoprim (BACTRIM DS,SEPTRA DS) 800-160 MG tablet Take 1 tablet by mouth 2 (two) times daily. 03/28/16 04/04/16  Georgiana Shore, PA-C    Family History No family history on file.  Social History Social History  Substance Use Topics  . Smoking status: Current Every Day Smoker    Types: Cigars  . Smokeless tobacco: Never Used     Comment: smokes black and milds (1/day)  . Alcohol use No     Allergies   Almond (diagnostic) and Valium [diazepam]   Review  of Systems Review of Systems  Constitutional: Negative for activity change, appetite change, chills and fever.  HENT: Negative for ear pain and sore throat.   Eyes: Negative for pain and visual disturbance.  Respiratory: Negative for cough, chest tightness, shortness of breath, wheezing and stridor.   Cardiovascular: Negative for chest pain, palpitations and leg swelling.  Gastrointestinal: Negative for abdominal distention, abdominal pain, diarrhea, nausea and vomiting.  Genitourinary: Negative for difficulty urinating, dysuria and hematuria.  Musculoskeletal: Negative for arthralgias, back pain, gait problem, joint swelling, myalgias, neck pain and neck stiffness.  Skin: Positive for wound. Negative for color change, pallor and rash.       Right knee 2 cm x 2 cm erythematous area with central purulent area 1mm.  Neurological: Negative for seizures and syncope.  All other systems reviewed and are negative.    Physical Exam Updated Vital Signs BP 143/71 (BP Location: Right Arm)   Pulse 76   Temp 98.4 F (36.9 C) (Oral)   Resp 16   Ht 5\' 9"  (1.753 m)   Wt 80.7 kg   SpO2 99%   BMI 26.29 kg/m   Physical Exam  Constitutional: He appears well-developed and well-nourished. No distress.  Afebrile, nontoxic-appearing, lying comfortably in bed in no acute distress.  HENT:  Head: Normocephalic and atraumatic.  Eyes: Conjunctivae and EOM are normal.  Neck: Normal range of motion. Neck supple.  Cardiovascular: Normal rate, regular rhythm, normal heart sounds and intact distal pulses.   No murmur heard. Pulmonary/Chest: Effort normal and breath sounds normal. No respiratory distress. He has no wheezes. He has no rales. He exhibits no tenderness.  Abdominal: Soft. There is no tenderness.  Musculoskeletal: Normal range of motion. He exhibits tenderness. He exhibits no edema or deformity.  Full range of motion of the knee, no swelling of the joint, erythema, warmth or signs of infection in  the joint. Small area of erythema and induration over the tibial tuberosity. Exquisitely tender to the touch. Neurovascularly intact distally.  Neurological: He is alert. No sensory deficit.  Skin: Skin is warm and dry. No rash noted. He is not diaphoretic. There is erythema. No pallor.  Psychiatric: He has a normal mood and affect. His behavior is normal.  Nursing note and vitals reviewed.    ED Treatments / Results  Labs (all labs ordered are listed, but only abnormal results are displayed) Labs Reviewed - No data to display  EKG  EKG Interpretation None       Radiology No results found.  Procedures Procedures (including critical care time)  EMERGENCY DEPARTMENT US SOFT TISSUE INTERPRETATION "Study: Limited Soft Tissue Ultrasound"  INDICATIONS: Soft tissue infection Multiple views of the body part were obtained in real-time with a multi-frequency linear probe  PERFORMED BY: Myself IMAGES ARCHIVED?: Yes SIDE:Right  BODY PART:Lower extremity INTERPRETATION:  No abcess noted shallow  INCISION AND DRAINAGE Performed by: Georgiana Shore Consent: Verbal consent obtained. Risks and benefits: risks, benefits and alternatives were discussed Type: abscess  Body area: skin over right tibial tuberosity  Anesthesia: local infiltration  Incision was made with a scalpel.  Local anesthetic: lidocaine 1% w/o epinephrine  Anesthetic total: 2 ml  Complexity: simple  Drainage: bloody  Drainage amount: 1ml  Packing material: none  Patient tolerance: Patient tolerated the procedure well with no immediate complications.    Medications Ordered in ED Medications  lidocaine (PF) (XYLOCAINE) 1 % injection 30 mL (not administered)  lidocaine (PF) (XYLOCAINE) 1 % injection (not administered)  HYDROcodone-acetaminophen (NORCO/VICODIN) 5-325 MG per tablet 1 tablet (1 tablet Oral Given 03/28/16 1824)     Initial Impression / Assessment and Plan / ED Course  I have  reviewed the triage vital signs and the nursing notes.  Pertinent labs & imaging results that were available during my care of the patient were reviewed by me and considered in my medical decision making (see chart for details).     Otherwise healthy 46 year old male presenting with right lower extremity soft tissue infection.  Ultrasound did not show an obvious abscess. On exam there was a small area of purulence and softness to the center. Patient was also discussed and seen by Dr. Estell Harpin who recommended incising the area to help with further drainage in addition to antibiotics. Performed small I&D. Very shallow pocket without purulent discharge, not amenable to packing.  Exam otherwise unremarkable and reassuring. Patient is afebrile, nontoxic-appearing.   Discharge home with Bactrim and close PCP follow-up and wound recheck in 48 hours. Patient was advised to use warm compresses after 24 hours.  Discussed strict return precautions. Patient was advised to return to the emergency department if experiencing any new or worsening symptoms. Patient clearly understood instructions and agreed with discharge plan.  Final Clinical Impressions(s) / ED Diagnoses   Final diagnoses:  Abscess    New Prescriptions Discharge Medication List as of 03/28/2016  8:36 PM    START taking  these medications   Details  HYDROcodone-acetaminophen (NORCO/VICODIN) 5-325 MG tablet Take 1 tablet by mouth every 6 (six) hours as needed for severe pain., Starting Tue 03/28/2016, Print    sulfamethoxazole-trimethoprim (BACTRIM DS,SEPTRA DS) 800-160 MG tablet Take 1 tablet by mouth 2 (two) times daily., Starting Tue 03/28/2016, Until Tue 04/04/2016, Print         Georgiana ShoreJessica B Stephen Turnbaugh, PA-C 03/28/16 [id number]    Bethann BerkshireJoseph Zammit, MD 03/30/16 [phone]

## 2016-03-30 ENCOUNTER — Emergency Department (HOSPITAL_COMMUNITY)
Admission: EM | Admit: 2016-03-30 | Discharge: 2016-03-30 | Disposition: A | Discharge status: Home / Self Care | Payer: Self-pay | Attending: Emergency Medicine | Admitting: Emergency Medicine

## 2016-03-30 ENCOUNTER — Encounter (HOSPITAL_COMMUNITY): Payer: Self-pay | Admitting: Emergency Medicine

## 2016-03-30 DIAGNOSIS — L02415 Cutaneous abscess of right lower limb: Secondary | ICD-10-CM | POA: Insufficient documentation

## 2016-03-30 DIAGNOSIS — F17290 Nicotine dependence, other tobacco product, uncomplicated: Secondary | ICD-10-CM | POA: Insufficient documentation

## 2016-03-30 DIAGNOSIS — L03115 Cellulitis of right lower limb: Secondary | ICD-10-CM | POA: Insufficient documentation

## 2016-03-30 MED ORDER — CLINDAMYCIN HCL 150 MG PO CAPS
450.0000 mg | ORAL_CAPSULE | Freq: Once | ORAL | Status: AC
  Administered 2016-03-30: 450 mg via ORAL
  Filled 2016-03-30: qty 3

## 2016-03-30 MED ORDER — CLINDAMYCIN HCL 150 MG PO CAPS: 450.0000 mg | ORAL_CAPSULE | Freq: Three times a day (TID) | ORAL | 0 refills | Status: AC

## 2016-03-30 MED ORDER — HYDROCODONE-ACETAMINOPHEN 5-325 MG PO TABS
1.0000 | ORAL_TABLET | Freq: Once | ORAL | Status: AC
  Administered 2016-03-30: 1 via ORAL
  Filled 2016-03-30: qty 1

## 2016-03-30 MED ORDER — HYDROCODONE-ACETAMINOPHEN 5-325 MG PO TABS: 1.0000 | ORAL_TABLET | ORAL | 0 refills | Status: DC | PRN

## 2016-03-30 MED ORDER — LIDOCAINE-EPINEPHRINE (PF) 2 %-1:200000 IJ SOLN
10.0000 mL | Freq: Once | INTRAMUSCULAR | Status: AC
  Administered 2016-03-30: 10 mL via INTRADERMAL
  Filled 2016-03-30: qty 20

## 2016-03-30 NOTE — ED Notes (Signed)
Pt alert & oriented x4, stable gait. Patient given discharge instructions, paperwork & prescription(s). Patient informed not to drive, operate any equipment & handel any important documents 4 hours after taking pain medication. Patient  instructed to stop at the registration desk to finish any additional paperwork. Patient  verbalized understanding. Pt left department w/ no further questions. 

## 2016-03-30 NOTE — ED Provider Notes (Signed)
[facility]-EMER[facility]ENCY DEPT Provider Note   CSN: [id number] Arrival date & time: 03/30/16  1845     History   Chief Complaint Chief Complaint  Patient presents with  . Abscess    HPI Kyle Ponce is a 46 y.o. male.  HPI  46 year old male presents with worsening right leg/knee redness and infection. Was seen here 2 days ago with a small abscess that was drained. However the patient states that no pus came out during the drainage. Since then the redness has been worsening. He has been taking his Bactrim as prescribed. No fevers. However now the redness is getting worse and seems to be spreading down his leg. The leg has been warm. He was bending his knee earlier tonight and a yellow tissue seemed to pop out. There is now a small hole at the center of the infection. Denies known medical problems. Some pain with ROM of knee but has full ROM.  History reviewed. No pertinent past medical history.  There are no active problems to display for this patient.   History reviewed. No pertinent surgical history.     Home Medications    Prior to Admission medications   Medication Sig Start Date End Date Taking? Authorizing Provider  clindamycin (CLEOCIN) 150 MG capsule Take 3 capsules (450 mg total) by mouth 3 (three) times daily. 03/30/16 04/06/16  Pricilla LovelessScott Aracelly Tencza, MD  HYDROcodone-acetaminophen (NORCO/VICODIN) 5-325 MG tablet Take 1 tablet by mouth every 4 (four) hours as needed for severe pain. 03/30/16   Pricilla LovelessScott Aela Bohan, MD  NON FORMULARY Take 2 capsules by mouth 2 (two) times daily. PLEXUS    Historical Provider, MD  NON FORMULARY Take 1 packet by mouth daily. *PLEXUS drink mixture    Historical Provider, MD    Family History No family history on file.  Social History Social History  Substance Use Topics  . Smoking status: Current Every Day Smoker    Types: Cigars  . Smokeless tobacco: Never Used     Comment: smokes black and milds (1/day)  . Alcohol use No     Allergies   Almond  (diagnostic) and Valium [diazepam]   Review of Systems Review of Systems  Constitutional: Negative for fever.  Musculoskeletal: Positive for myalgias.  Skin: Positive for color change and wound.  All other systems reviewed and are negative.    Physical Exam Updated Vital Signs BP 138/66   Pulse 82   Temp 98.4 F (36.9 C)   Resp 18   Ht 5\' 9"  (1.753 m)   Wt 178 lb (80.7 kg)   SpO2 99%   BMI 26.29 kg/m   Physical Exam  Constitutional: He is oriented to person, place, and time. He appears well-developed and well-nourished.  HENT:  Head: Normocephalic and atraumatic.  Right Ear: External ear normal.  Left Ear: External ear normal.  Nose: Nose normal.  Eyes: Right eye exhibits no discharge. Left eye exhibits no discharge.  Neck: Neck supple.  Pulmonary/Chest: Effort normal.  Abdominal: He exhibits no distension.  Musculoskeletal: He exhibits no edema.       Right knee: He exhibits normal range of motion and no effusion.       Right lower leg: He exhibits tenderness and swelling.       Legs: Neurological: He is alert and oriented to person, place, and time.  Skin: Skin is warm and dry.  Nursing note and vitals reviewed.    ED Treatments / Results  Labs (all labs ordered are listed, but only abnormal results  are displayed) Labs Reviewed - No data to display  EKG  EKG Interpretation None       Radiology No results found.  Procedures .Marland KitchenIncision and Drainage Date/Time: 03/30/2016 8:06 PM Performed by: Pricilla Loveless Authorized by: Pricilla Loveless   Consent:    Consent obtained:  Verbal   Risks discussed:  Incomplete drainage, bleeding, pain and damage to other organs Location:    Type:  Abscess   Location:  Lower extremity   Lower extremity location:  Knee   Knee location:  R knee Anesthesia (see MAR for exact dosages):    Anesthesia method:  Local infiltration   Local anesthetic:  Lidocaine 2% WITH epi Procedure type:    Complexity:   Simple Procedure details:    Incision types:  Elliptical   Scalpel blade:  11   Wound management:  Probed and deloculated and irrigated with saline   Drainage:  Bloody and purulent   Drainage amount:  Scant   Wound treatment:  Wound left open   Packing materials:  None Post-procedure details:    Patient tolerance of procedure:  Tolerated well, no immediate complications   (including critical care time)  ULTRASOUND LIMITED SOFT TISSUE/ MUSCULOSKELETAL: right knee Indication: abscess Linear probe used to evaluate area of interest in two planes. Findings:  Small fluid collection, mostly inflammation/cellulitis Performed by: Dr Criss Alvine Images saved electronically  Medications Ordered in ED Medications  clindamycin (CLEOCIN) capsule 450 mg (not administered)  lidocaine-EPINEPHrine (XYLOCAINE W/EPI) 2 %-1:200000 (PF) injection 10 mL (10 mLs Intradermal Given by Other 03/30/16 1934)  HYDROcodone-acetaminophen (NORCO/VICODIN) 5-325 MG per tablet 1 tablet (1 tablet Oral Given 03/30/16 1933)     Initial Impression / Assessment and Plan / ED Course  I have reviewed the triage vital signs and the nursing notes.  Pertinent labs & imaging results that were available during my care of the patient were reviewed by me and considered in my medical decision making (see chart for details).     Patient has been worsening since drainage 2 days ago. It appears that there was only a minimal opening before and then this likely closed up. Family showed me the finger popped out of his wound and it appears to be pus. He is tender at the site but not overtly tender or concerning for a deep space infection. I have opened the small wound on both sides in an elliptical incision. This was irrigated with saline. Minimal return but there were loculations were broken up. He will be changed from Bactrim to clindamycin. Unclear if he really failed Bactrim or if he just had not had good drainage before. He is overall well  appearing and does not appear significantly ill. He does not have a PCP, advised to return here for wound check in 2 days or sooner if worsening.  Final Clinical Impressions(s) / ED Diagnoses   Final diagnoses:  Cellulitis and abscess of right leg    New Prescriptions New Prescriptions   CLINDAMYCIN (CLEOCIN) 150 MG CAPSULE    Take 3 capsules (450 mg total) by mouth 3 (three) times daily.   HYDROCODONE-ACETAMINOPHEN (NORCO/VICODIN) 5-325 MG TABLET    Take 1 tablet by mouth every 4 (four) hours as needed for severe pain.     Pricilla Loveless, MD 03/30/16 2011

## 2016-03-30 NOTE — ED Triage Notes (Signed)
Pt c/o right knee abscess that he had drained Tuesday here, but pt states the swelling and redness is worse.

## 2016-03-30 NOTE — ED Notes (Signed)
Redness noted to the right knee & pt states has been increasing down the right leg. Clear drainage noted from sight. Pt has been taking antibiotics as prescribed.

## 2016-05-02 ENCOUNTER — Ambulatory Visit (HOSPITAL_COMMUNITY)
Admission: RE | Admit: 2016-05-02 | Discharge: 2016-05-02 | Disposition: A | Discharge status: Home / Self Care | Payer: BLUE CROSS/BLUE SHIELD | Source: Ambulatory Visit | Attending: Internal Medicine | Admitting: Internal Medicine

## 2016-05-02 ENCOUNTER — Other Ambulatory Visit (HOSPITAL_COMMUNITY): Payer: Self-pay | Admitting: Internal Medicine

## 2016-05-02 DIAGNOSIS — M549 Dorsalgia, unspecified: Secondary | ICD-10-CM

## 2016-05-02 DIAGNOSIS — G8929 Other chronic pain: Secondary | ICD-10-CM | POA: Insufficient documentation

## 2016-05-02 DIAGNOSIS — M546 Pain in thoracic spine: Secondary | ICD-10-CM | POA: Diagnosis present

## 2016-05-24 ENCOUNTER — Other Ambulatory Visit (HOSPITAL_COMMUNITY): Payer: Self-pay | Admitting: Sports Medicine

## 2016-05-24 ENCOUNTER — Ambulatory Visit (HOSPITAL_COMMUNITY)
Admission: RE | Admit: 2016-05-24 | Discharge: 2016-05-24 | Disposition: A | Discharge status: Home / Self Care | Payer: BLUE CROSS/BLUE SHIELD | Source: Ambulatory Visit | Attending: Sports Medicine | Admitting: Sports Medicine

## 2016-05-24 DIAGNOSIS — Z01818 Encounter for other preprocedural examination: Secondary | ICD-10-CM

## 2017-05-11 ENCOUNTER — Other Ambulatory Visit (HOSPITAL_COMMUNITY): Payer: Self-pay | Admitting: Nurse Practitioner

## 2017-05-11 ENCOUNTER — Ambulatory Visit (HOSPITAL_COMMUNITY)
Admission: RE | Admit: 2017-05-11 | Discharge: 2017-05-11 | Disposition: A | Discharge status: Home / Self Care | Payer: BLUE CROSS/BLUE SHIELD | Source: Ambulatory Visit | Attending: Nurse Practitioner | Admitting: Nurse Practitioner

## 2017-05-11 DIAGNOSIS — R52 Pain, unspecified: Secondary | ICD-10-CM

## 2017-05-11 DIAGNOSIS — R937 Abnormal findings on diagnostic imaging of other parts of musculoskeletal system: Secondary | ICD-10-CM | POA: Diagnosis not present

## 2017-05-11 DIAGNOSIS — S2242XA Multiple fractures of ribs, left side, initial encounter for closed fracture: Secondary | ICD-10-CM | POA: Diagnosis not present

## 2017-05-31 IMAGING — DX DG THORACIC SPINE 3V
5 series · 5 of 5 positions shown · non-contrast
Comparison: None.

CLINICAL DATA: Chronic back pain.

EXAM:
THORACIC SPINE - 3 VIEWS

[t-spine ap (1 of 2)]
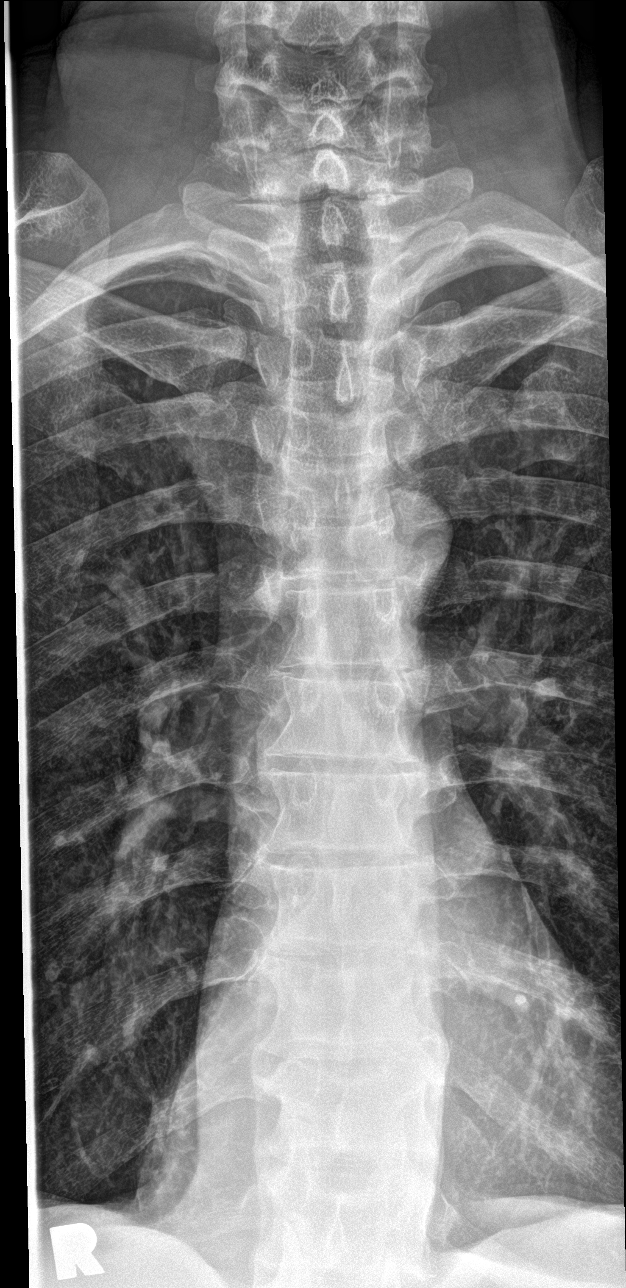

[t-spine lat (1 of 2)]
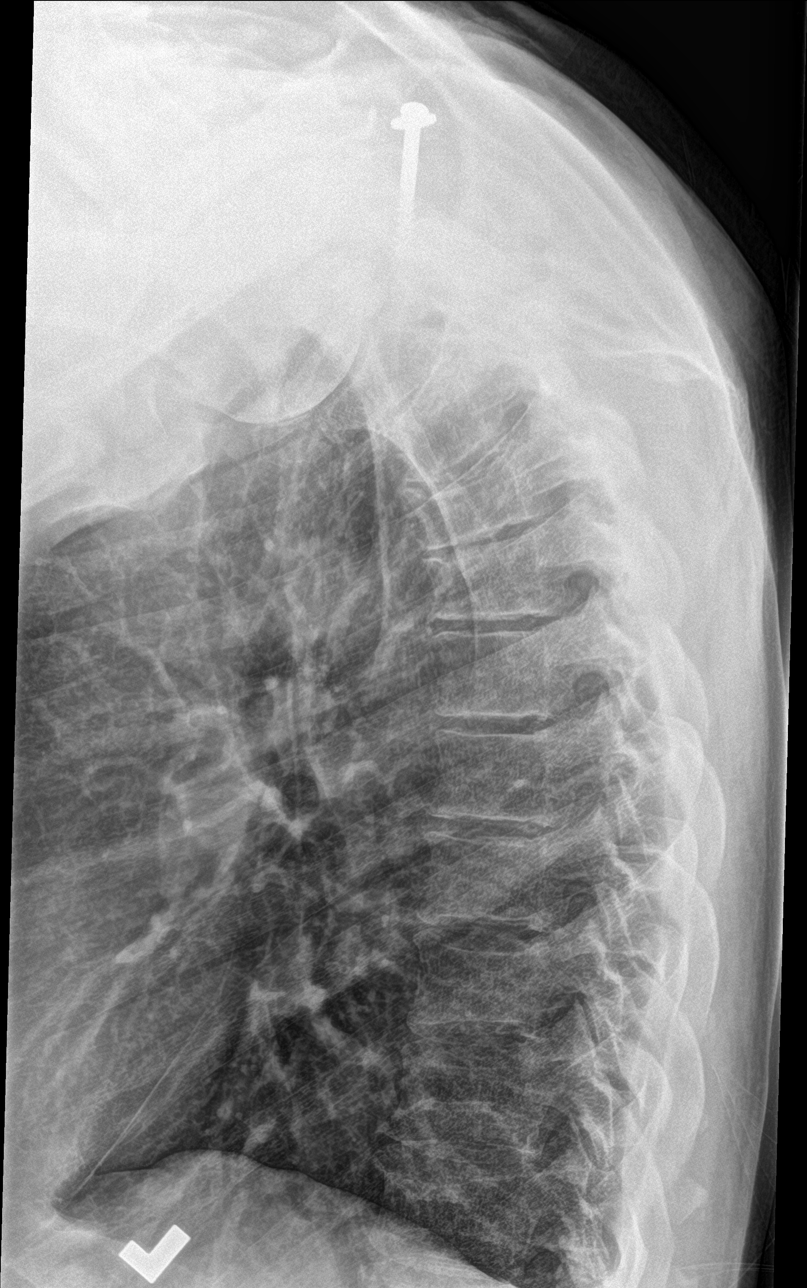

[t-spine swimmers]
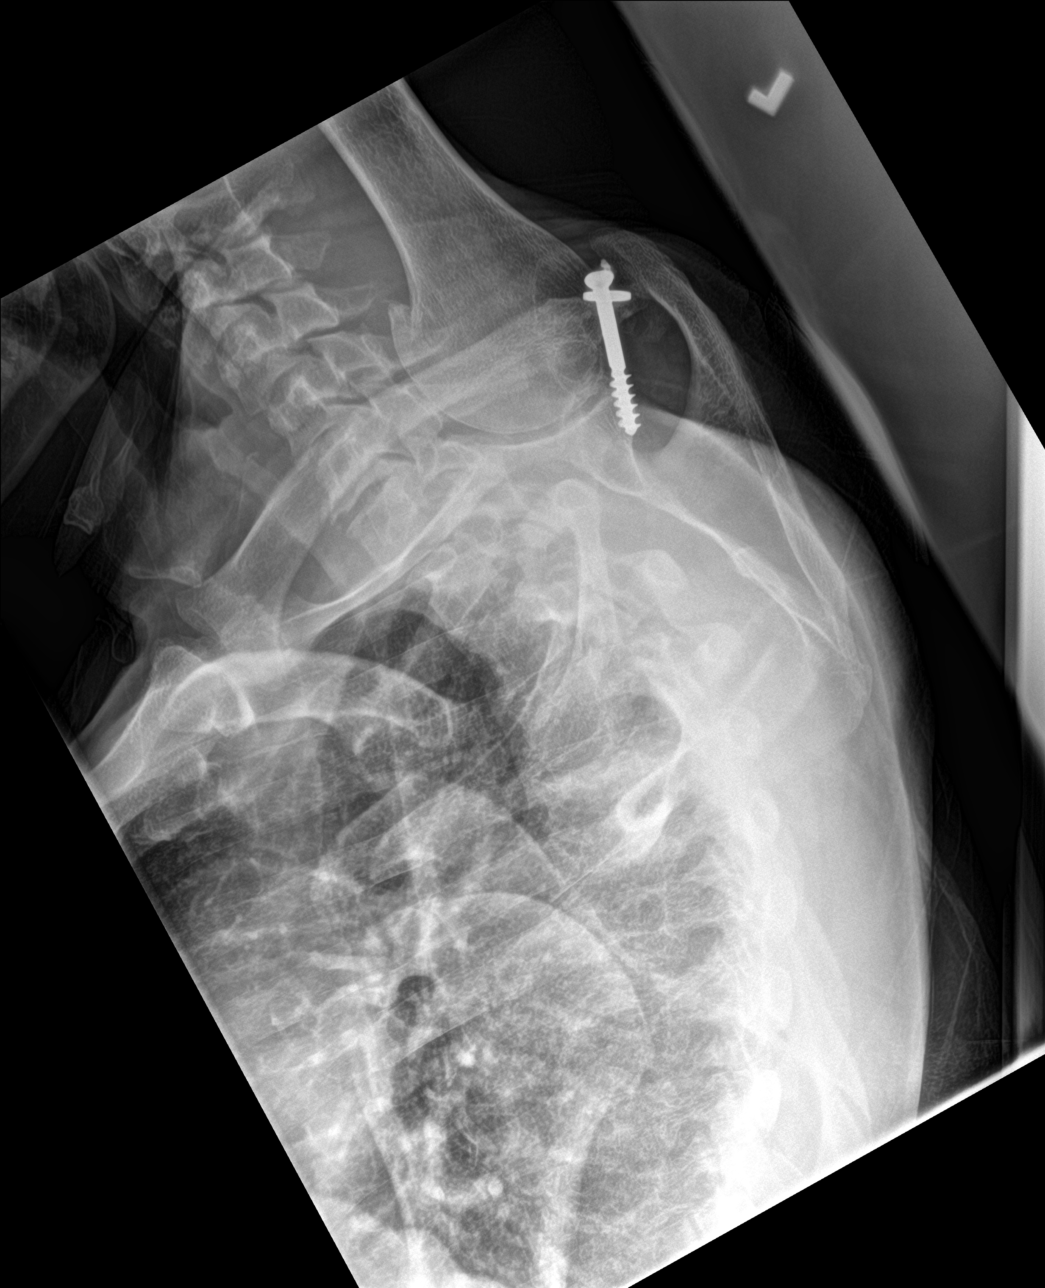

[t-spine ap (2 of 2)]
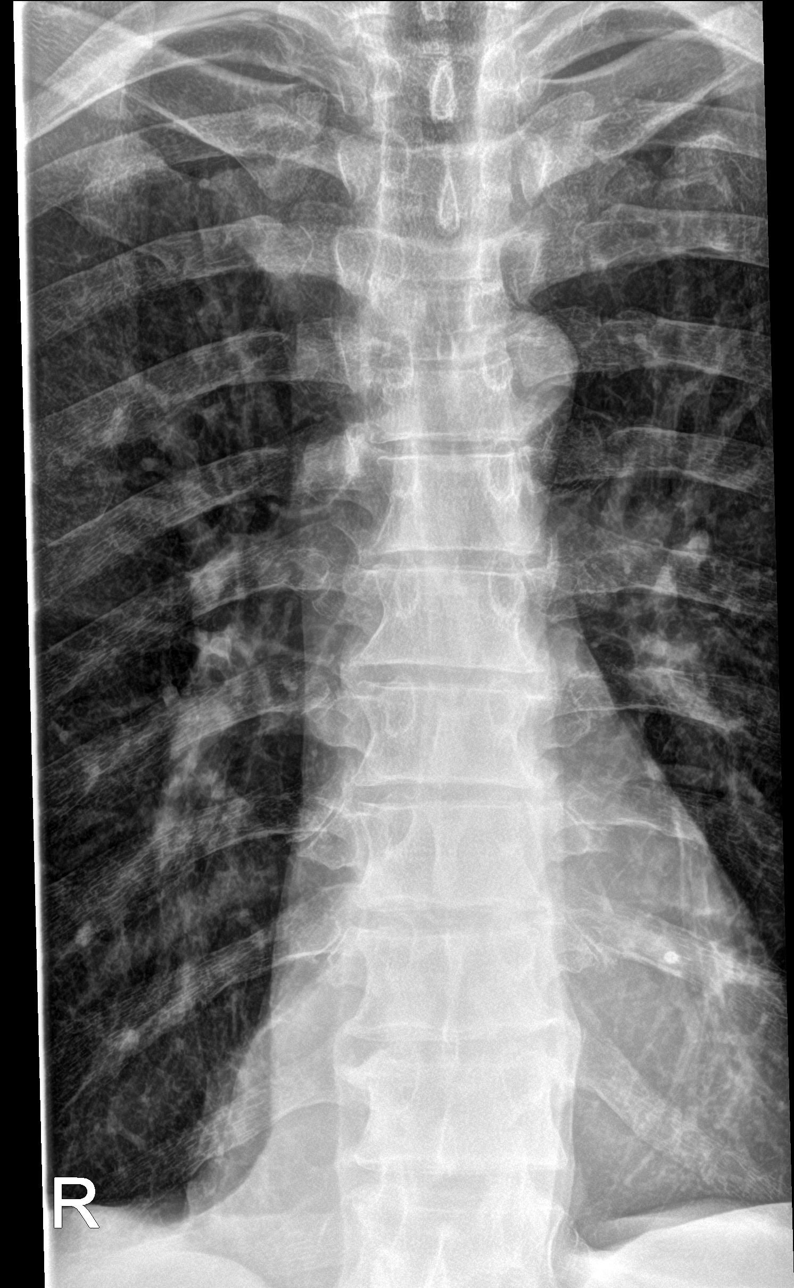

[t-spine lat (2 of 2)]
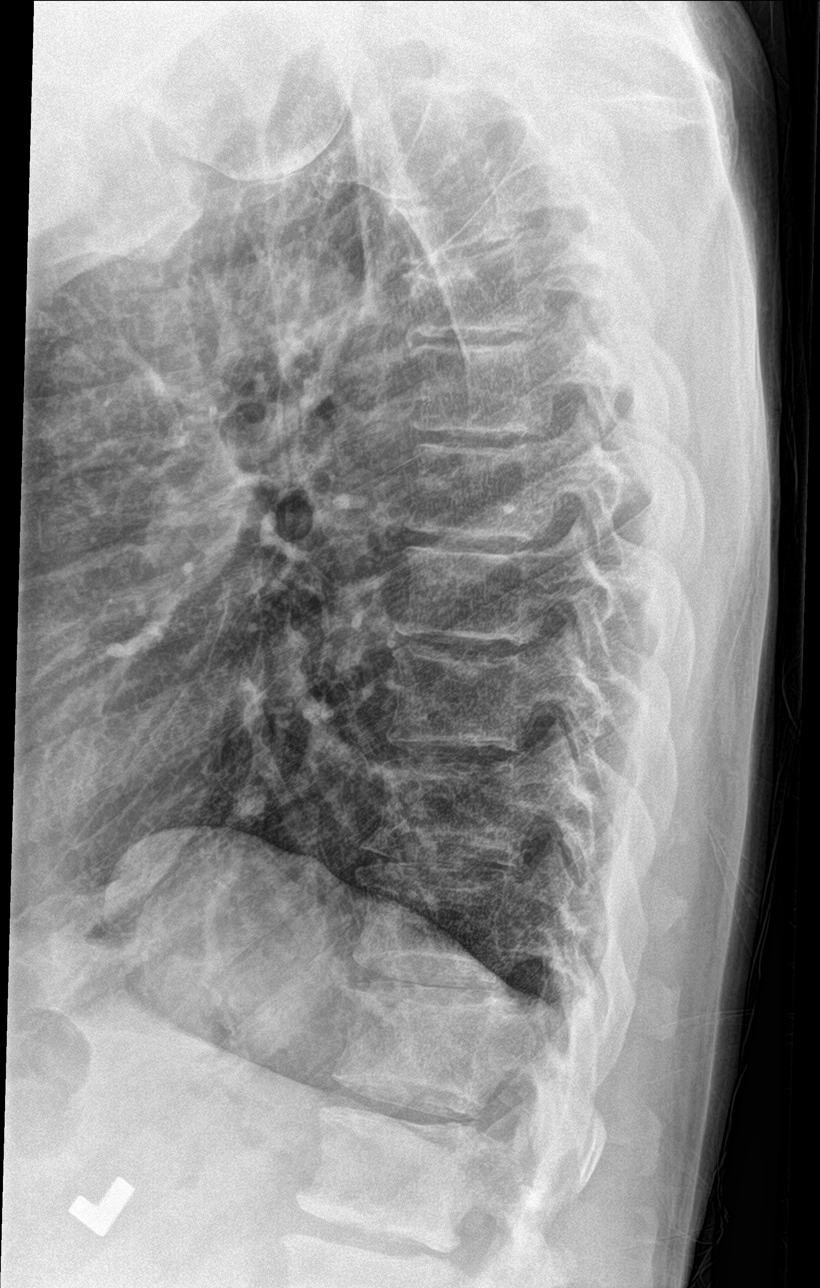

[5 of 5 positions shown; findings below may reference images not displayed]

FINDINGS: Mild compression deformities are noted in the upper thoracic spine.
Probable remote compression fractures but no old studies for
confirmation. The thoracic vertebral bodies are normally aligned.
Disc spaces are fairly well maintained. Mild degenerative changes.
No abnormal paraspinal soft tissue swelling. The visualized
posterior ribs are intact and visualized lungs are clear. Surgical
changes are noted involving the left shoulder.
IMPRESSION: Probable remote compression fractures in the upper thoracic spine
but no prior studies are available for comparison.

Normal alignment and mild degenerative changes.

## 2018-02-28 ENCOUNTER — Other Ambulatory Visit: Payer: Self-pay | Admitting: Chiropractic Medicine

## 2018-02-28 DIAGNOSIS — M5412 Radiculopathy, cervical region: Secondary | ICD-10-CM

## 2018-03-03 ENCOUNTER — Ambulatory Visit
Admission: RE | Admit: 2018-03-03 | Discharge: 2018-03-03 | Disposition: A | Discharge status: Home / Self Care | Payer: BLUE CROSS/BLUE SHIELD | Source: Ambulatory Visit | Attending: Chiropractic Medicine | Admitting: Chiropractic Medicine

## 2018-03-03 DIAGNOSIS — M5412 Radiculopathy, cervical region: Secondary | ICD-10-CM

## 2018-08-22 NOTE — H&P (Signed)
  Patient's anticipated LOS is less than 2 midnights, meeting these requirements: - Younger than 48 - Lives within 1 hour of care - Has a competent adult at home to recover with post-op recover - NO history of  - Chronic pain requiring opiods  - Diabetes  - Coronary Artery Disease  - Heart failure  - Heart attack  - Stroke  - DVT/VTE  - Cardiac arrhythmia  - Respiratory Failure/COPD  - Renal failure  - Anemia  - Advanced Liver disease       Kyle Ponce is an 48 y.o. male.    Chief Complaint: right shoulder pain  HPI: Pt is a 48 y.o. male complaining of right shoulder pain for multiple years. Pain had continually increased since the beginning. X-rays in the clinic show end-stage arthritic changes of the right shoulder. Pt has tried various conservative treatments which have failed to alleviate their symptoms, including injections and therapy. Various options are discussed with the patient. Risks, benefits and expectations were discussed with the patient. Patient understand the risks, benefits and expectations and wishes to proceed with surgery.   PCP:  Monico Blitz, MD  D/C Plans: Home  PMH: No past medical history on file.  PSH: No past surgical history on file.  Social History:  reports that he has been smoking cigars. He has never used smokeless tobacco. He reports that he does not drink alcohol or use drugs.  Allergies:  Allergies  Allergen Reactions  . Almond (Diagnostic) Anaphylaxis  . Valium [Diazepam]     Makes patient active,anxiety.    Medications: No current facility-administered medications for this encounter.    Current Outpatient Medications  Medication Sig Dispense Refill  . HYDROcodone-acetaminophen (NORCO/VICODIN) 5-325 MG tablet Take 1 tablet by mouth every 4 (four) hours as needed for severe pain. 10 tablet 0  . NON FORMULARY Take 2 capsules by mouth 2 (two) times daily. PLEXUS    . NON FORMULARY Take 1 packet by mouth daily. *PLEXUS drink  mixture      No results found for this or any previous visit (from the past 48 hour(s)). No results found.  ROS: Pain with rom of the right upper extremity  Physical Exam: Alert and oriented 48 y.o. male in no acute distress Cranial nerves 2-12 intact Cervical spine: full rom with no tenderness, nv intact distally Chest: active breath sounds bilaterally, no wheeze rhonchi or rales Heart: regular rate and rhythm, no murmur Abd: non tender non distended with active bowel sounds Hip is stable with rom  Right shoulder pain with rom and crepitus nv intact distally No rashes or edema Strength of ER/IR 5/5 bilaterally  Assessment/Plan Assessment: right shoulder end stage osteoarthritis  Plan:  Patient will undergo a right total shoulder by Dr. Veverly Fells at [facility]. Risks benefits and expectations were discussed with the patient. Patient understand risks, benefits and expectations and wishes to proceed. Preoperative templating of the joint replacement has been completed, documented, and submitted to the Operating Room personnel in order to optimize intra-operative equipment management.   Merla Riches PA-C, MPAS [facility] is now [employer] [address], [place], [place], [place] [address] Phone: [phone] [email or web address] Facebook  [employer]

## 2018-10-18 ENCOUNTER — Inpatient Hospital Stay (HOSPITAL_COMMUNITY)
Admission: RE | Admit: 2018-10-18 | Payer: BC Managed Care – PPO | Source: Home / Self Care | Admitting: Orthopedic Surgery

## 2018-10-18 ENCOUNTER — Encounter (HOSPITAL_COMMUNITY): Admission: RE | Payer: Self-pay | Source: Home / Self Care

## 2018-10-18 SURGERY — ARTHROPLASTY, SHOULDER, TOTAL
Anesthesia: General | Laterality: Right

## 2022-03-09 ENCOUNTER — Other Ambulatory Visit: Payer: Self-pay | Admitting: Orthopedic Surgery

## 2022-03-09 DIAGNOSIS — M25511 Pain in right shoulder: Secondary | ICD-10-CM

## 2022-05-01 DIAGNOSIS — J449 Chronic obstructive pulmonary disease, unspecified: Secondary | ICD-10-CM

## 2022-05-01 HISTORY — DX: Chronic obstructive pulmonary disease, unspecified: J44.9

## 2022-06-07 NOTE — H&P (Signed)
Patient's anticipated LOS is less than 2 midnights, meeting these requirements: - Younger than 21 - Lives within 1 hour of care - Has a competent adult at home to recover with post-op recover - NO history of  - Chronic pain requiring opiods  - Diabetes  - Coronary Artery Disease  - Heart failure  - Heart attack  - Stroke  - DVT/VTE  - Cardiac arrhythmia  - Respiratory Failure/COPD  - Renal failure  - Anemia  - Advanced Liver disease     Kyle Ponce is an 52 y.o. male.    Chief Complaint: right shoulder pain  HPI: Pt is a 52 y.o. male complaining of right shoulder pain for multiple years. Pain had continually increased since the beginning. X-rays in the clinic show end-stage arthritic changes of the right shoulder. Pt has tried various conservative treatments which have failed to alleviate their symptoms, including injections and therapy. Various options are discussed with the patient. Risks, benefits and expectations were discussed with the patient. Patient understand the risks, benefits and expectations and wishes to proceed with surgery.   PCP:  Kirstie Peri, MD  D/C Plans: Home  PMH: No past medical history on file.  PSH: No past surgical history on file.  Social History:  reports that he has been smoking cigars. He has never used smokeless tobacco. He reports that he does not drink alcohol and does not use drugs. BMI: Estimated body mass index is 26.29 kg/m as calculated from the following:   Height as of 03/30/16: 5\' 9"  (1.753 m).   Weight as of 03/30/16: 80.7 kg.  No results found for: "ALBUMIN" Diabetes: Patient does not have a diagnosis of diabetes.     Smoking Status:   reports that he has been smoking cigars. He has never used smokeless tobacco.    Allergies:  Allergies  Allergen Reactions   Almond (Diagnostic) Anaphylaxis   Valium [Diazepam]     Makes patient active,anxiety.    Medications: No current facility-administered medications for this  encounter.   Current Outpatient Medications  Medication Sig Dispense Refill   HYDROcodone-acetaminophen (NORCO/VICODIN) 5-325 MG tablet Take 1 tablet by mouth every 4 (four) hours as needed for severe pain. 10 tablet 0   NON FORMULARY Take 2 capsules by mouth 2 (two) times daily. PLEXUS     NON FORMULARY Take 1 packet by mouth daily. *PLEXUS drink mixture      No results found for this or any previous visit (from the past 48 hour(s)). No results found.  ROS: Pain with rom of the right upper extremity  Physical Exam: Alert and oriented 52 y.o. male in no acute distress Cranial nerves 2-12 intact Cervical spine: full rom with no tenderness, nv intact distally Chest: active breath sounds bilaterally, no wheeze rhonchi or rales Heart: regular rate and rhythm, no murmur Abd: non tender non distended with active bowel sounds Hip is stable with rom  Right shoulder painful and weak rom Nv intact distally No rashes or edema distally  Assessment/Plan Assessment: right shoulder cuff arthropathy  Plan:  Patient will undergo a right reverse total shoulder by Dr. Ranell Patrick at [place] Risks benefits and expectations were discussed with the patient. Patient understand risks, benefits and expectations and wishes to proceed. Preoperative templating of the joint replacement has been completed, documented, and submitted to the Operating Room personnel in order to optimize intra-operative equipment management.   Brad Nancy Arvin PA-C, MPAS [facility] is now [facility]  [place] [address], [place], [place] [address] Phone: [phone] [email or web address] Facebook  [employer]

## 2022-06-15 NOTE — Progress Notes (Signed)
PCP -  ?Cardiologist -  ? ?PPM/ICD -  ?Device Orders -  ?Rep Notified -  ? ?Chest x-ray -  ?EKG -  ?Stress Test -  ?ECHO -  ?Cardiac Cath -  ? ?Sleep Study -  ?CPAP -  ? ?Fasting Blood Sugar -  ?Checks Blood Sugar _____ times a day ? ?Blood Thinner Instructions: ?Aspirin Instructions: ? ?ERAS Protcol - ?PRE-SURGERY Ensure or G2-  ? ? ?COVID vaccine - ? ?Activity-- ?Anesthesia review:  ? ?Patient denies shortness of breath, fever, cough and chest pain at PAT appointment ? ? ?All instructions explained to the patient, with a verbal understanding of the material. Patient agrees to go over the instructions while at home for a better understanding. Patient also instructed to self quarantine after being tested for COVID-19. The opportunity to ask questions was provided. ?  ? ?

## 2022-06-15 NOTE — Patient Instructions (Addendum)
SURGICAL WAITING ROOM VISITATION  Patients having surgery or a procedure may have no more than 2 support people in the waiting area - these visitors may rotate.    Children under the age of 83 must have an adult with them who is not the patient.  If the patient needs to stay at the hospital during part of their recovery, the visitor guidelines for inpatient rooms apply.  Pre-op nurse will coordinate an appropriate time for 1 support person to accompany patient in pre-op.  This support person may not rotate.    Please refer to the [facility] website for the visitor guidelines for Inpatients (after your surgery is over and you are in a regular room).       Your procedure is scheduled on: 06-30-22    Report to [facility] Main Entrance    Report to admitting at      0930 AM   Call this number if you have problems the morning of surgery [phone]   Do not eat food :After Midnight.   After Midnight you may have the following liquids until _0900_____ AM DAY OF SURGERY   then nothing by mouth  Water Non-Citrus Juices (without pulp, NO RED-Apple, White grape, White cranberry) Black Coffee (NO MILK/CREAM OR CREAMERS, sugar ok)  Clear Tea (NO MILK/CREAM OR CREAMERS, sugar ok) regular and decaf                             Plain Jell-O (NO RED)                                           Fruit ices (not with fruit pulp, NO RED)                                     Popsicles (NO RED)                                                               Sports drinks like Gatorade (NO RED)                    The day of surgery:  Drink ONE (1) Pre-Surgery Clear Ensure at     0850  AM the morning of surgery. Drink in one sitting. Do not sip.  This drink was given to you during your hospital  pre-op appointment visit. Nothing else to drink after completing the  Pre-Surgery Clear Ensure by 0900 am           If you have questions, please contact your surgeon's office.   FOLLOW ANY  ADDITIONAL PRE OP INSTRUCTIONS YOU RECEIVED FROM YOUR SURGEON'S OFFICE!!!     Oral Hygiene is also important to reduce your risk of infection.                                    Remember - BRUSH YOUR TEETH THE MORNING OF SURGERY WITH YOUR REGULAR TOOTHPASTE  DENTURES WILL BE  REMOVED PRIOR TO SURGERY PLEASE DO NOT APPLY "Poly grip" OR ADHESIVES!!!   Do NOT smoke after Midnight   Take these medicines the morning of surgery with A SIP OF WATER: inhaler and bring with you . Consult prescriber of SUBOX[employer] for instructions                               You may not have any metal on your body including hair pins, jewelry, and body piercing             Do not wear  lotions, powders, perfumes/cologne, or deodorant                Men may shave face and neck.   Do not bring valuables to the hospital. [facility] IS NOT             RESPONSIBLE   FOR VALUABLES.   Contacts, glasses, dentures or bridgework may not be worn into surgery.   Bring small overnight bag day of surgery.   DO NOT BRING YOUR HOME MEDICATIONS TO THE HOSPITAL. PHARMACY WILL DISPENSE MEDICATIONS LISTED ON YOUR MEDICATION LIST TO YOU DURING YOUR ADMISSION IN THE HOSPITAL!    Patients discharged on the day of surgery will not be allowed to drive home.  Someone NEEDS to stay with you for the first 24 hours after anesthesia.                Please read over the following fact sheets you were given: IF YOU HAVE QUESTIONS ABOUT YOUR PRE-OP INSTRUCTIONS PLEASE CALL [phone]   I If you test positive for Covid or have been in contact with anyone that has tested positive in the last 10 days please notify you surgeon.    [facility]- Preparing for Total Shoulder Arthroplasty    Before surgery, you can play an important role. Because skin is not sterile, your skin needs to be as free of germs as possible. You can reduce the number of germs on your skin by using the following products. Benzoyl Peroxide Gel Reduces the number of  germs present on the skin Applied twice a day to shoulder area starting two days before surgery    ==================================================================  Please follow these instructions carefully:  BENZOYL PEROXIDE 5% GEL  Please do not use if you have an allergy to benzoyl peroxide.   If your skin becomes reddened/irritated stop using the benzoyl peroxide.  Starting two days before surgery, apply as follows: Apply benzoyl peroxide in the morning and at night. Apply after taking a shower. If you are not taking a shower clean entire shoulder front, back, and side along with the armpit with a clean wet washcloth.  Place a quarter-sized dollop on your shoulder and rub in thoroughly, making sure to cover the front, back, and side of your shoulder, along with the armpit.   2 days before ____ AM   ____ PM              1 day before ____ AM   ____ PM                         Do this twice a day for two days.  (Last application is the night before surgery, AFTER using the CHG soap as described below).  Do NOT apply benzoyl peroxide gel on the day of surgery.   Pre-operative 5 CHG Bath Instructions  You can play a key role in reducing the risk of infection after surgery. Your skin needs to be as free of germs as possible. You can reduce the number of germs on your skin by washing with CHG (chlorhexidine gluconate) soap before surgery. CHG is an antiseptic soap that kills germs and continues to kill germs even after washing.   DO NOT use if you have an allergy to chlorhexidine/CHG or antibacterial soaps. If your skin becomes reddened or irritated, stop using the CHG and notify one of our RNs at [phone].   Please shower with the CHG soap starting 4 days before surgery using the following schedule:     Please keep in mind the following:  DO NOT shave, including legs and underarms, starting the day of your first shower.   You may shave your face at any point before/day of  surgery.  Place clean sheets on your bed the day you start using CHG soap. Use a clean washcloth (not used since being washed) for each shower. DO NOT sleep with pets once you start using the CHG.   CHG Shower Instructions:  If you choose to wash your hair and private area, wash first with your normal shampoo/soap.  After you use shampoo/soap, rinse your hair and body thoroughly to remove shampoo/soap residue.  Turn the water OFF and apply about 3 tablespoons (45 ml) of CHG soap to a CLEAN washcloth.  Apply CHG soap ONLY FROM YOUR NECK DOWN TO YOUR TOES (washing for 3-5 minutes)  DO NOT use CHG soap on face, private areas, open wounds, or sores.  Pay special attention to the area where your surgery is being performed.  If you are having back surgery, having someone wash your back for you may be helpful. Wait 2 minutes after CHG soap is applied, then you may rinse off the CHG soap.  Pat dry with a clean towel  Put on clean clothes/pajamas   If you choose to wear lotion, please use ONLY the CHG-compatible lotions on the back of this paper.     Additional instructions for the day of surgery: DO NOT APPLY any lotions, deodorants, cologne, or perfumes.   Put on clean/comfortable clothes.  Brush your teeth.  Ask your nurse before applying any prescription medications to the skin.      CHG Compatible Lotions   Aveeno Moisturizing lotion  Cetaphil Moisturizing Cream  Cetaphil Moisturizing Lotion  Clairol Herbal Essence Moisturizing Lotion, Dry Skin  Clairol Herbal Essence Moisturizing Lotion, Extra Dry Skin  Clairol Herbal Essence Moisturizing Lotion, Normal Skin  Curel Age Defying Therapeutic Moisturizing Lotion with Alpha Hydroxy  Curel Extreme Care Body Lotion  Curel Soothing Hands Moisturizing Hand Lotion  Curel Therapeutic Moisturizing Cream, Fragrance-Free  Curel Therapeutic Moisturizing Lotion, Fragrance-Free  Curel Therapeutic Moisturizing Lotion, Original Formula  Eucerin  Daily Replenishing Lotion  Eucerin Dry Skin Therapy Plus Alpha Hydroxy Crme  Eucerin Dry Skin Therapy Plus Alpha Hydroxy Lotion  Eucerin Original Crme  Eucerin Original Lotion  Eucerin Plus Crme Eucerin Plus Lotion  Eucerin TriLipid Replenishing Lotion  Keri Anti-Bacterial Hand Lotion  Keri Deep Conditioning Original Lotion Dry Skin Formula Softly Scented  Keri Deep Conditioning Original Lotion, Fragrance Free Sensitive Skin Formula  Keri Lotion Fast Absorbing Fragrance Free Sensitive Skin Formula  Keri Lotion Fast Absorbing Softly Scented Dry Skin Formula  Keri Original Lotion  Keri Skin Renewal Lotion Keri Silky Smooth Lotion  Keri Silky Smooth Sensitive Skin Lotion  Nivea Body Creamy Conditioning Oil  Nivea  Body Extra Enriched Lotion  Nivea Body Original Lotion  Nivea Body Sheer Moisturizing Lotion Nivea Crme  Nivea Skin Firming Lotion  NutraDerm 30 Skin Lotion  NutraDerm Skin Lotion  NutraDerm Therapeutic Skin Cream  NutraDerm Therapeutic Skin Lotion  ProShield Protective Hand Cream  Provon moisturizing lotion       Incentive Spirometer  An incentive spirometer is a tool that can help keep your lungs clear and active. This tool measures how well you are filling your lungs with each breath. Taking long deep breaths may help reverse or decrease the chance of developing breathing (pulmonary) problems (especially infection) following: A long period of time when you are unable to move or be active. BEFORE THE PROCEDURE  If the spirometer includes an indicator to show your best effort, your nurse or respiratory therapist will set it to a desired goal. If possible, sit up straight or lean slightly forward. Try not to slouch. Hold the incentive spirometer in an upright position. INSTRUCTIONS FOR USE  Sit on the edge of your bed if possible, or sit up as far as you can in bed or on a chair. Hold the incentive spirometer in an upright position. Breathe out normally. Place the  mouthpiece in your mouth and seal your lips tightly around it. Breathe in slowly and as deeply as possible, raising the piston or the ball toward the top of the column. Hold your breath for 3-5 seconds or for as long as possible. Allow the piston or ball to fall to the bottom of the column. Remove the mouthpiece from your mouth and breathe out normally. Rest for a few seconds and repeat Steps 1 through 7 at least 10 times every 1-2 hours when you are awake. Take your time and take a few normal breaths between deep breaths. The spirometer may include an indicator to show your best effort. Use the indicator as a goal to work toward during each repetition. After each set of 10 deep breaths, practice coughing to be sure your lungs are clear. If you have an incision (the cut made at the time of surgery), support your incision when coughing by placing a pillow or rolled up towels firmly against it. Once you are able to get out of bed, walk around indoors and cough well. You may stop using the incentive spirometer when instructed by your caregiver.  RISKS AND COMPLICATIONS Take your time so you do not get dizzy or light-headed. If you are in pain, you may need to take or ask for pain medication before doing incentive spirometry. It is harder to take a deep breath if you are having pain. AFTER USE Rest and breathe slowly and easily. It can be helpful to keep track of a log of your progress. Your caregiver can provide you with a simple table to help with this. If you are using the spirometer at home, follow these instructions: SEEK MEDICAL CARE IF:  You are having difficultly using the spirometer. You have trouble using the spirometer as often as instructed. Your pain medication is not giving enough relief while using the spirometer. You develop fever of 100.5 F (38.1 C) or higher. SEEK IMMEDIATE MEDICAL CARE IF:  You cough up bloody sputum that had not been present before. You develop fever of 102 F  (38.9 C) or greater. You develop worsening pain at or near the incision site. MAKE SURE YOU:  Understand these instructions. Will watch your condition. Will get help right away if you are not doing well  or get worse. Document Released: 05/29/2006 Document Revised: 04/10/2011 Document Reviewed: 07/30/2006 [facility] Patient Information 2014 [place], [place].   ________________________________________________________________________

## 2022-06-19 ENCOUNTER — Encounter (HOSPITAL_COMMUNITY)
Admission: RE | Admit: 2022-06-19 | Discharge: 2022-06-19 | Disposition: A | Discharge status: Home / Self Care | Payer: Medicare Other | Source: Ambulatory Visit | Attending: Anesthesiology | Admitting: Anesthesiology

## 2022-06-19 DIAGNOSIS — Z01818 Encounter for other preprocedural examination: Secondary | ICD-10-CM

## 2022-06-23 ENCOUNTER — Encounter (HOSPITAL_COMMUNITY): Payer: Self-pay

## 2022-06-23 ENCOUNTER — Encounter (HOSPITAL_COMMUNITY)
Admission: RE | Admit: 2022-06-23 | Discharge: 2022-06-23 | Disposition: A | Discharge status: Home / Self Care | Payer: Medicare Other | Source: Ambulatory Visit | Attending: Orthopedic Surgery | Admitting: Orthopedic Surgery

## 2022-06-23 ENCOUNTER — Other Ambulatory Visit: Payer: Self-pay

## 2022-06-23 DIAGNOSIS — Z01812 Encounter for preprocedural laboratory examination: Secondary | ICD-10-CM | POA: Diagnosis present

## 2022-06-23 DIAGNOSIS — Z01818 Encounter for other preprocedural examination: Secondary | ICD-10-CM

## 2022-06-23 HISTORY — DX: Unspecified osteoarthritis, unspecified site: M19.90

## 2022-06-23 LAB — CBC
HCT: 42.1 % (ref 39.0–52.0)
Hemoglobin: 13.8 g/dL (ref 13.0–17.0)
MCH: 31.9 pg (ref 26.0–34.0)
MCHC: 32.8 g/dL (ref 30.0–36.0)
MCV: 97.2 fL (ref 80.0–100.0)
Platelets: 211 10*3/uL (ref 150–400)
RBC: 4.33 MIL/uL (ref 4.22–5.81)
RDW: 11.9 % (ref 11.5–15.5)
WBC: 7.4 10*3/uL (ref 4.0–10.5)
nRBC: 0 % (ref 0.0–0.2)

## 2022-06-23 LAB — SURGICAL PCR SCREEN
MRSA, PCR: NEGATIVE
Staphylococcus aureus: NEGATIVE

## 2022-06-23 NOTE — Patient Instructions (Addendum)
DUE TO COVID-19 ONLY TWO VISITORS  (aged 51 and older)  ARE ALLOWED TO COME WITH YOU AND STAY IN THE WAITING ROOM ONLY DURING PRE OP AND PROCEDURE.   **NO VISITORS ARE ALLOWED IN THE SHORT STAY AREA OR RECOVERY ROOM!!**  IF YOU WILL BE ADMITTED INTO THE HOSPITAL YOU ARE ALLOWED ONLY FOUR SUPPORT PEOPLE DURING VISITATION HOURS ONLY (7 AM -8PM)   The support person(s) must pass our screening, gel in and out, and wear a mask at all times, including in the patient's room. Patients must also wear a mask when staff or their support person are in the room. Visitors GUEST BADGE MUST BE WORN VISIBLY  One adult visitor may remain with you overnight and MUST be in the room by 8 P.M.     Your procedure is scheduled on: 06/30/22   Report to [facility] Main Entrance    Report to admitting at  6:45 AM   Call this number if you have problems the morning of surgery [phone]   Do not eat food :After Midnight.   After Midnight you may have the following liquids until _6:00_____ AM/  DAY OF SURGERY  Water Black Coffee (sugar ok, NO MILK/CREAM OR CREAMERS)  Tea (sugar ok, NO MILK/CREAM OR CREAMERS) regular and decaf                             Plain Jell-O (NO RED)                                           Fruit ices (not with fruit pulp, NO RED)                                     Popsicles (NO RED)                                                                  Juice: apple, WHITE grape, WHITE cranberry Sports drinks like Gatorade (NO RED)                   The day of surgery:  Drink ONE (1) Pre-Surgery Clear Ensure  at  5:45 AM the morning of surgery. Drink in one sitting. Do not sip.  This drink was given to you during your hospital  pre-op appointment visit. Nothing else to drink after completing the  Pre-Surgery Clear Ensure at 6:00 AM          If you have questions, please contact your surgeon's office.      Oral Hygiene is also important to reduce your risk of infection.                                     Remember - BRUSH YOUR TEETH THE MORNING OF SURGERY WITH YOUR REGULAR TOOTHPASTE  DENTURES WILL BE REMOVED PRIOR TO SURGERY PLEASE DO NOT APPLY "Poly grip" OR ADHESIVES!!!    Take these medicines the morning  of surgery with A SIP OF WATER: use your inhalers and bring them with you.                               You may not have any metal on your body including jewelry, and body piercing             Do not wear  lotions, powders, cologne, or deodorant                Men may shave face and neck.   Do not bring valuables to the hospital. [facility] IS NOT             RESPONSIBLE   FOR VALUABLES.   Contacts, glasses, or bridgework may not be worn into surgery.   Bring small overnight bag day of surgery.   PHARMACY WILL DISPENSE MEDICATIONS LISTED ON YOUR MEDICATION LIST TO YOU DURING YOUR ADMISSION IN THE HOSPITAL!    Patients discharged on the day of surgery will not be allowed to drive home.  Someone NEEDS to stay with you for the first 24 hours after anesthesia.   Special Instructions: Bring a copy of your healthcare power of attorney and living will documents the day of surgery if you haven't scanned them before.              Please read over the following fact sheets you were given: IF YOU HAVE QUESTIONS ABOUT YOUR PRE-OP INSTRUCTIONS PLEASE CALL [phone]    [facility]- Preparing for Total Shoulder Arthroplasty    Before surgery, you can play an important role. Because skin is not sterile, your skin needs to be as free of germs as possible. You can reduce the number of germs on your skin by using the following products. Benzoyl Peroxide Gel Reduces the number of germs present on the skin Applied twice a day to shoulder area starting two days before surgery    ==================================================================  Please follow these instructions carefully:  BENZOYL PEROXIDE 5% GEL  Please do not use if you have an  allergy to benzoyl peroxide.   If your skin becomes reddened/irritated stop using the benzoyl peroxide.  Starting two days before surgery, apply as follows: Apply benzoyl peroxide in the morning and at night. Apply after taking a shower. If you are not taking a shower clean entire shoulder front, back, and side along with the armpit with a clean wet washcloth.  Place a quarter-sized dollop on your shoulder and rub in thoroughly, making sure to cover the front, back, and side of your shoulder, along with the armpit.   2 days before ____ AM   ____ PM              1 day before ____ AM   ____ PM                         Do this twice a day for two days.  (Last application is the night before surgery, AFTER using the CHG soap as described below).  Do NOT apply benzoyl peroxide gel on the day of surgery.   Pre-operative 5 CHG Bath Instructions   You can play a key role in reducing the risk of infection after surgery. Your skin needs to be as free of germs as possible. You can reduce the number of germs on your skin by washing with CHG (chlorhexidine gluconate) soap before surgery.  CHG is an antiseptic soap that kills germs and continues to kill germs even after washing.   DO NOT use if you have an allergy to chlorhexidine/CHG or antibacterial soaps. If your skin becomes reddened or irritated, stop using the CHG and notify one of our RNs at [phone].   Please shower with the CHG soap starting 4 days before surgery using the following schedule:     Please keep in mind the following:  DO NOT shave, including legs and underarms, starting the day of your first shower.   You may shave your face at any point before/day of surgery.  Place clean sheets on your bed the day you start using CHG soap. Use a clean washcloth (not used since being washed) for each shower. DO NOT sleep with pets once you start using the CHG.   CHG Shower Instructions:  If you choose to wash your hair and private area,  wash first with your normal shampoo/soap.  After you use shampoo/soap, rinse your hair and body thoroughly to remove shampoo/soap residue.  Turn the water OFF and apply about 3 tablespoons (45 ml) of CHG soap to a CLEAN washcloth.  Apply CHG soap ONLY FROM YOUR NECK DOWN TO YOUR TOES (washing for 3-5 minutes)  DO NOT use CHG soap on face, private areas, open wounds, or sores.  Pay special attention to the area where your surgery is being performed.  If you are having back surgery, having someone wash your back for you may be helpful. Wait 2 minutes after CHG soap is applied, then you may rinse off the CHG soap.  Pat dry with a clean towel  Put on clean clothes/pajamas   If you choose to wear lotion, please use ONLY the CHG-compatible lotions on the back of this paper.     Additional instructions for the day of surgery: DO NOT APPLY any lotions, deodorants, cologne, or perfumes.   Put on clean/comfortable clothes.  Brush your teeth.  Ask your nurse before applying any prescription medications to the skin.      CHG Compatible Lotions   Aveeno Moisturizing lotion  Cetaphil Moisturizing Cream  Cetaphil Moisturizing Lotion  Clairol Herbal Essence Moisturizing Lotion, Dry Skin  Clairol Herbal Essence Moisturizing Lotion, Extra Dry Skin  Clairol Herbal Essence Moisturizing Lotion, Normal Skin  Curel Age Defying Therapeutic Moisturizing Lotion with Alpha Hydroxy  Curel Extreme Care Body Lotion  Curel Soothing Hands Moisturizing Hand Lotion  Curel Therapeutic Moisturizing Cream, Fragrance-Free  Curel Therapeutic Moisturizing Lotion, Fragrance-Free  Curel Therapeutic Moisturizing Lotion, Original Formula  Eucerin Daily Replenishing Lotion  Eucerin Dry Skin Therapy Plus Alpha Hydroxy Crme  Eucerin Dry Skin Therapy Plus Alpha Hydroxy Lotion  Eucerin Original Crme  Eucerin Original Lotion  Eucerin Plus Crme Eucerin Plus Lotion  Eucerin TriLipid Replenishing Lotion  Keri  Anti-Bacterial Hand Lotion  Keri Deep Conditioning Original Lotion Dry Skin Formula Softly Scented  Keri Deep Conditioning Original Lotion, Fragrance Free Sensitive Skin Formula  Keri Lotion Fast Absorbing Fragrance Free Sensitive Skin Formula  Keri Lotion Fast Absorbing Softly Scented Dry Skin Formula  Keri Original Lotion  Keri Skin Renewal Lotion Keri Silky Smooth Lotion  Keri Silky Smooth Sensitive Skin Lotion  Nivea Body Creamy Conditioning Oil  Nivea Body Extra Enriched Teacher, adult education Moisturizing Lotion Nivea Crme  Nivea Skin Firming Lotion  NutraDerm 30 Skin Lotion  NutraDerm Skin Lotion  NutraDerm Therapeutic Skin Cream  NutraDerm Therapeutic Skin Lotion  ProShield Protective  Hand Cream  Provon moisturizing lotion

## 2022-06-23 NOTE — Progress Notes (Addendum)
Anesthesia note:   PCP - Dr. Franchot Mimes Cardiologist -no Other-   Chest x-ray - no EKG - no Stress Test - no ECHO - no Cardiac Cath - no CABG-no Pacemaker/ICD device last checked:no  Sleep Study - no CPAP -   Pt is pre diabetic-no CBG at PAT visit- Fasting Blood Sugar at home- Checks Blood Sugar _____  Blood Thinner:no Blood Thinner Instructions: Aspirin Instructions: Last Dose:  Anesthesia review: Yes  reason:Chronic pain  Patient denies shortness of breath, fever, cough and chest pain at PAT appointment. 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  Patient verbalized understanding of instructions that were given to them at the PAT appointment. Patient was also instructed that they will need to review over the PAT instructions again at home before surgery.yes wife was with him.

## 2022-06-29 NOTE — Anesthesia Preprocedure Evaluation (Addendum)
Anesthesia Evaluation  Patient identified by MRN, date of birth, ID band Patient awake    Reviewed: Allergy & Precautions, NPO status , Patient's Chart, lab work & pertinent test results  History of Anesthesia Complications Negative for: history of anesthetic complications  Airway Mallampati: I  TM Distance: >3 FB Neck ROM: Full    Dental  (+) Dental Advisory Given, Chipped   Pulmonary COPD,  COPD inhaler, Current Smoker and Patient abstained from smoking.   Pulmonary exam normal        Cardiovascular negative cardio ROS Normal cardiovascular exam     Neuro/Psych negative neurological ROS  negative psych ROS   GI/Hepatic negative GI ROS,,,(+)     substance abuse  marijuana use  Endo/Other  negative endocrine ROS    Renal/GU negative Renal ROS     Musculoskeletal  (+) Arthritis ,  narcotic dependent  Abdominal   Peds  Hematology negative hematology ROS (+)   Anesthesia Other Findings On suboxone   Reproductive/Obstetrics                             Anesthesia Physical Anesthesia Plan  ASA: 2  Anesthesia Plan: General   Post-op Pain Management: Regional block* and Tylenol PO (pre-op)*   Induction: Intravenous  PONV Risk Score and Plan: 1 and Treatment may vary due to age or medical condition, Ondansetron, Dexamethasone and Midazolam  Airway Management Planned: Oral ETT  Additional Equipment: None  Intra-op Plan:   Post-operative Plan: Extubation in OR  Informed Consent: I have reviewed the patients History and Physical, chart, labs and discussed the procedure including the risks, benefits and alternatives for the proposed anesthesia with the patient or authorized representative who has indicated his/her understanding and acceptance.     Dental advisory given  Plan Discussed with: CRNA and Anesthesiologist  Anesthesia Plan Comments:        Anesthesia Quick  Evaluation

## 2022-06-30 ENCOUNTER — Ambulatory Visit (HOSPITAL_COMMUNITY): Payer: Medicare Other

## 2022-06-30 ENCOUNTER — Ambulatory Visit (HOSPITAL_COMMUNITY): Payer: Medicare Other | Admitting: Anesthesiology

## 2022-06-30 ENCOUNTER — Other Ambulatory Visit: Payer: Self-pay

## 2022-06-30 ENCOUNTER — Encounter (HOSPITAL_COMMUNITY): Payer: Self-pay | Admitting: Orthopedic Surgery

## 2022-06-30 ENCOUNTER — Encounter (HOSPITAL_COMMUNITY)
Admission: RE | Disposition: A | Discharge status: Home / Self Care | Payer: Self-pay | Source: Home / Self Care | Attending: Orthopedic Surgery

## 2022-06-30 ENCOUNTER — Ambulatory Visit (HOSPITAL_COMMUNITY)
Admission: RE | Admit: 2022-06-30 | Discharge: 2022-06-30 | Disposition: A | Discharge status: Home / Self Care | Payer: Medicare Other | Attending: Orthopedic Surgery | Admitting: Orthopedic Surgery

## 2022-06-30 ENCOUNTER — Ambulatory Visit (HOSPITAL_COMMUNITY): Payer: Medicare Other | Admitting: Physician Assistant

## 2022-06-30 DIAGNOSIS — J449 Chronic obstructive pulmonary disease, unspecified: Secondary | ICD-10-CM

## 2022-06-30 DIAGNOSIS — M25711 Osteophyte, right shoulder: Secondary | ICD-10-CM | POA: Insufficient documentation

## 2022-06-30 DIAGNOSIS — M19011 Primary osteoarthritis, right shoulder: Secondary | ICD-10-CM | POA: Diagnosis present

## 2022-06-30 DIAGNOSIS — F17290 Nicotine dependence, other tobacco product, uncomplicated: Secondary | ICD-10-CM | POA: Insufficient documentation

## 2022-06-30 DIAGNOSIS — F17210 Nicotine dependence, cigarettes, uncomplicated: Secondary | ICD-10-CM | POA: Diagnosis not present

## 2022-06-30 HISTORY — PX: REVERSE SHOULDER ARTHROPLASTY: SHX5054

## 2022-06-30 SURGERY — ARTHROPLASTY, SHOULDER, TOTAL, REVERSE
Anesthesia: General | Site: Shoulder | Laterality: Right

## 2022-06-30 MED ORDER — OXYCODONE HCL 5 MG PO TABS: 5.0000 mg | ORAL_TABLET | ORAL | 0 refills | Status: AC | PRN

## 2022-06-30 MED ORDER — CEFAZOLIN SODIUM-DEXTROSE 2-4 GM/100ML-% IV SOLN
2.0000 g | INTRAVENOUS | Status: AC
  Administered 2022-06-30: 2 g via INTRAVENOUS
  Filled 2022-06-30: qty 100

## 2022-06-30 MED ORDER — 0.9 % SODIUM CHLORIDE (POUR BTL) OPTIME
TOPICAL | Status: DC | PRN
  Administered 2022-06-30: 1000 mL

## 2022-06-30 MED ORDER — PHENYLEPHRINE HCL (PRESSORS) 10 MG/ML IV SOLN
INTRAVENOUS | Status: DC | PRN
  Administered 2022-06-30: 160 ug via INTRAVENOUS

## 2022-06-30 MED ORDER — PROPOFOL 10 MG/ML IV BOLUS
INTRAVENOUS | Status: DC | PRN
  Administered 2022-06-30: 200 mg via INTRAVENOUS

## 2022-06-30 MED ORDER — ACETAMINOPHEN 500 MG PO TABS
1000.0000 mg | ORAL_TABLET | Freq: Once | ORAL | Status: AC
  Administered 2022-06-30: 1000 mg via ORAL
  Filled 2022-06-30: qty 2

## 2022-06-30 MED ORDER — BUPIVACAINE-EPINEPHRINE (PF) 0.25% -1:200000 IJ SOLN
INTRAMUSCULAR | Status: DC | PRN
  Administered 2022-06-30: 14 mL

## 2022-06-30 MED ORDER — GLYCOPYRROLATE 0.2 MG/ML IJ SOLN
INTRAMUSCULAR | Status: DC | PRN
  Administered 2022-06-30: .2 mg via INTRAVENOUS

## 2022-06-30 MED ORDER — BUPIVACAINE HCL (PF) 0.5 % IJ SOLN
INTRAMUSCULAR | Status: DC | PRN
  Administered 2022-06-30: 15 mL via PERINEURAL

## 2022-06-30 MED ORDER — SUGAMMADEX SODIUM 200 MG/2ML IV SOLN
INTRAVENOUS | Status: DC | PRN
  Administered 2022-06-30: 200 mg via INTRAVENOUS

## 2022-06-30 MED ORDER — ONDANSETRON HCL 4 MG/2ML IJ SOLN
INTRAMUSCULAR | Status: DC | PRN
  Administered 2022-06-30: 4 mg via INTRAVENOUS

## 2022-06-30 MED ORDER — ORAL CARE MOUTH RINSE: 15.0000 mL | Freq: Once | OROMUCOSAL | Status: AC

## 2022-06-30 MED ORDER — AMISULPRIDE (ANTIEMETIC) 5 MG/2ML IV SOLN: 10.0000 mg | Freq: Once | INTRAVENOUS | Status: DC | PRN

## 2022-06-30 MED ORDER — BUPIVACAINE LIPOSOME 1.3 % IJ SUSP
INTRAMUSCULAR | Status: DC | PRN
  Administered 2022-06-30: 10 mL via PERINEURAL

## 2022-06-30 MED ORDER — OXYCODONE HCL 5 MG PO TABS: 5.0000 mg | ORAL_TABLET | Freq: Once | ORAL | Status: DC | PRN

## 2022-06-30 MED ORDER — FENTANYL CITRATE (PF) 100 MCG/2ML IJ SOLN
INTRAMUSCULAR | Status: DC | PRN
  Administered 2022-06-30: 50 ug via INTRAVENOUS

## 2022-06-30 MED ORDER — LACTATED RINGERS IV SOLN: INTRAVENOUS | Status: DC | PRN

## 2022-06-30 MED ORDER — MIDAZOLAM HCL 2 MG/2ML IJ SOLN
2.0000 mg | Freq: Once | INTRAMUSCULAR | Status: AC
  Administered 2022-06-30: 2 mg via INTRAVENOUS
  Filled 2022-06-30: qty 2

## 2022-06-30 MED ORDER — EPHEDRINE SULFATE (PRESSORS) 50 MG/ML IJ SOLN
INTRAMUSCULAR | Status: DC | PRN
  Administered 2022-06-30 (×5): 10 mg via INTRAVENOUS

## 2022-06-30 MED ORDER — LACTATED RINGERS IV SOLN: INTRAVENOUS | Status: DC

## 2022-06-30 MED ORDER — ROCURONIUM BROMIDE 100 MG/10ML IV SOLN
INTRAVENOUS | Status: DC | PRN
  Administered 2022-06-30: 40 mg via INTRAVENOUS

## 2022-06-30 MED ORDER — PROPOFOL 10 MG/ML IV BOLUS
INTRAVENOUS | Status: AC
  Filled 2022-06-30: qty 20

## 2022-06-30 MED ORDER — METHOCARBAMOL 500 MG PO TABS: 500.0000 mg | ORAL_TABLET | Freq: Three times a day (TID) | ORAL | 1 refills | Status: AC | PRN

## 2022-06-30 MED ORDER — TRANEXAMIC ACID-NACL 1000-0.7 MG/100ML-% IV SOLN
1000.0000 mg | INTRAVENOUS | Status: AC
  Administered 2022-06-30: 1000 mg via INTRAVENOUS
  Filled 2022-06-30: qty 100

## 2022-06-30 MED ORDER — CHLORHEXIDINE GLUCONATE 0.12 % MT SOLN
15.0000 mL | Freq: Once | OROMUCOSAL | Status: AC
  Administered 2022-06-30: 15 mL via OROMUCOSAL

## 2022-06-30 MED ORDER — FENTANYL CITRATE PF 50 MCG/ML IJ SOSY
100.0000 ug | PREFILLED_SYRINGE | Freq: Once | INTRAMUSCULAR | Status: AC
  Administered 2022-06-30: 100 ug via INTRAVENOUS
  Filled 2022-06-30: qty 2

## 2022-06-30 MED ORDER — FENTANYL CITRATE (PF) 100 MCG/2ML IJ SOLN
INTRAMUSCULAR | Status: AC
  Filled 2022-06-30: qty 2

## 2022-06-30 MED ORDER — STERILE WATER FOR IRRIGATION IR SOLN
Status: DC | PRN
  Administered 2022-06-30: 2000 mL

## 2022-06-30 MED ORDER — EPINEPHRINE PF 1 MG/ML IJ SOLN
INTRAMUSCULAR | Status: AC
  Filled 2022-06-30: qty 1

## 2022-06-30 MED ORDER — BUPIVACAINE HCL 0.25 % IJ SOLN
INTRAMUSCULAR | Status: AC
  Filled 2022-06-30: qty 1

## 2022-06-30 MED ORDER — FENTANYL CITRATE PF 50 MCG/ML IJ SOSY: 25.0000 ug | PREFILLED_SYRINGE | INTRAMUSCULAR | Status: DC | PRN

## 2022-06-30 MED ORDER — ONDANSETRON HCL 4 MG PO TABS: 4.0000 mg | ORAL_TABLET | Freq: Three times a day (TID) | ORAL | 1 refills | Status: AC | PRN

## 2022-06-30 MED ORDER — DEXAMETHASONE SODIUM PHOSPHATE 4 MG/ML IJ SOLN
INTRAMUSCULAR | Status: DC | PRN
  Administered 2022-06-30: 8 mg via INTRAVENOUS

## 2022-06-30 MED ORDER — PHENYLEPHRINE HCL-NACL 20-0.9 MG/250ML-% IV SOLN
INTRAVENOUS | Status: AC
  Filled 2022-06-30: qty 250

## 2022-06-30 MED ORDER — LIDOCAINE HCL (CARDIAC) PF 100 MG/5ML IV SOSY
PREFILLED_SYRINGE | INTRAVENOUS | Status: DC | PRN
  Administered 2022-06-30: 40 mg via INTRAVENOUS

## 2022-06-30 MED ORDER — PROPOFOL 500 MG/50ML IV EMUL
INTRAVENOUS | Status: DC | PRN
  Administered 2022-06-30: 40 ug/kg/min via INTRAVENOUS

## 2022-06-30 MED ORDER — PHENYLEPHRINE HCL (PRESSORS) 10 MG/ML IV SOLN
INTRAVENOUS | Status: DC | PRN
  Administered 2022-06-30 (×4): 80 ug via SUBCUTANEOUS

## 2022-06-30 MED ORDER — OXYCODONE HCL 5 MG/5ML PO SOLN: 5.0000 mg | Freq: Once | ORAL | Status: DC | PRN

## 2022-06-30 MED ORDER — PHENYLEPHRINE HCL-NACL 20-0.9 MG/250ML-% IV SOLN
INTRAVENOUS | Status: DC | PRN
  Administered 2022-06-30: 25 ug/min via INTRAVENOUS

## 2022-06-30 MED ORDER — DEXMEDETOMIDINE HCL IN NACL 80 MCG/20ML IV SOLN
INTRAVENOUS | Status: DC | PRN
  Administered 2022-06-30: 8 ug via INTRAVENOUS

## 2022-06-30 SURGICAL SUPPLY — 64 items
AID PSTN UNV HD RSTRNT DISP (MISCELLANEOUS) ×1
BIT DRILL 1.6MX128 (BIT) IMPLANT
BIT DRILL 170X2.5X (BIT) IMPLANT
BIT DRL 170X2.5X (BIT) ×1
BLADE SAG 18X100X1.27 (BLADE) ×1 IMPLANT
COVER BACK TABLE 60X90IN (DRAPES) ×1 IMPLANT
COVER SURGICAL LIGHT HANDLE (MISCELLANEOUS) ×1 IMPLANT
CUP HUMERAL 42 PLUS 3 (Orthopedic Implant) IMPLANT
DRAPE INCISE IOBAN 66X45 STRL (DRAPES) ×1 IMPLANT
DRAPE ORTHO SPLIT 77X108 STRL (DRAPES) ×2
DRAPE SHEET LG 3/4 BI-LAMINATE (DRAPES) ×1 IMPLANT
DRAPE SURG ORHT 6 SPLT 77X108 (DRAPES) ×2 IMPLANT
DRAPE TOP 10253 STERILE (DRAPES) ×1 IMPLANT
DRAPE U-SHAPE 47X51 STRL (DRAPES) ×1 IMPLANT
DRILL 2.5 (BIT) ×1
DRSG ADAPTIC 3X8 NADH LF (GAUZE/BANDAGES/DRESSINGS) ×1 IMPLANT
DURAPREP 26ML APPLICATOR (WOUND CARE) ×1 IMPLANT
ELECT BLADE TIP CTD 4 INCH (ELECTRODE) ×1 IMPLANT
ELECT NDL TIP 2.8 STRL (NEEDLE) ×1 IMPLANT
ELECT NEEDLE TIP 2.8 STRL (NEEDLE) ×1 IMPLANT
ELECT REM PT RETURN 15FT ADLT (MISCELLANEOUS) ×1 IMPLANT
EPIPHYSI RIGHT SZ 2 (Shoulder) ×1 IMPLANT
EPIPHYSIS RIGHT SZ 2 (Shoulder) IMPLANT
FACESHIELD WRAPAROUND (MASK) ×1 IMPLANT
FACESHIELD WRAPAROUND OR TEAM (MASK) ×1 IMPLANT
GAUZE PAD ABD 8X10 STRL (GAUZE/BANDAGES/DRESSINGS) ×1 IMPLANT
GAUZE SPONGE 4X4 12PLY STRL (GAUZE/BANDAGES/DRESSINGS) ×1 IMPLANT
GLENOSPHERE XTEND LAT 42+0 STD (Miscellaneous) IMPLANT
GLOVE BIOGEL PI IND STRL 7.5 (GLOVE) ×1 IMPLANT
GLOVE BIOGEL PI IND STRL 8.5 (GLOVE) ×1 IMPLANT
GLOVE ORTHO TXT STRL SZ7.5 (GLOVE) ×1 IMPLANT
GLOVE SURG ORTHO 8.5 STRL (GLOVE) ×1 IMPLANT
GOWN STRL REUS W/ TWL XL LVL3 (GOWN DISPOSABLE) ×2 IMPLANT
GOWN STRL REUS W/TWL XL LVL3 (GOWN DISPOSABLE) ×2
KIT BASIN OR (CUSTOM PROCEDURE TRAY) ×1 IMPLANT
KIT TURNOVER KIT A (KITS) IMPLANT
MANIFOLD NEPTUNE II (INSTRUMENTS) ×1 IMPLANT
METAGLENE DELTA EXTEND (Trauma) IMPLANT
METAGLENE DXTEND (Trauma) ×1 IMPLANT
NDL MAYO CATGUT SZ4 TPR NDL (NEEDLE) IMPLANT
NEEDLE MAYO CATGUT SZ4 (NEEDLE) ×1 IMPLANT
NS IRRIG 1000ML POUR BTL (IV SOLUTION) ×1 IMPLANT
PACK SHOULDER (CUSTOM PROCEDURE TRAY) ×1 IMPLANT
PIN GUIDE 1.2 (PIN) IMPLANT
PIN GUIDE GLENOPHERE 1.5MX300M (PIN) IMPLANT
PIN METAGLENE 2.5 (PIN) IMPLANT
RESTRAINT HEAD UNIVERSAL NS (MISCELLANEOUS) ×1 IMPLANT
SCREW 4.5X18MM (Screw) ×1 IMPLANT
SCREW 4.5X24MM (Screw) ×2 IMPLANT
SCREW 48L (Screw) IMPLANT
SCREW BN 18X4.5XSTRL SHLDR (Screw) IMPLANT
SCREW BN 24X4.5XLCK STRL (Screw) IMPLANT
SLING ARM FOAM STRAP LRG (SOFTGOODS) IMPLANT
SPONGE T-LAP 4X18 ~~LOC~~+RFID (SPONGE) IMPLANT
STEM 12 HA (Stem) IMPLANT
STRIP CLOSURE SKIN 1/2X4 (GAUZE/BANDAGES/DRESSINGS) ×1 IMPLANT
SUT FIBERWIRE #2 38 T-5 BLUE (SUTURE) ×2
SUT MNCRL AB 4-0 PS2 18 (SUTURE) ×1 IMPLANT
SUT VIC AB 0 CT1 36 (SUTURE) ×1 IMPLANT
SUT VIC AB 0 CT2 27 (SUTURE) ×1 IMPLANT
SUT VIC AB 2-0 CT1 27 (SUTURE) ×1
SUT VIC AB 2-0 CT1 TAPERPNT 27 (SUTURE) ×1 IMPLANT
SUTURE FIBERWR #2 38 T-5 BLUE (SUTURE) ×1 IMPLANT
TOWEL OR 17X26 10 PK STRL BLUE (TOWEL DISPOSABLE) ×1 IMPLANT

## 2022-06-30 NOTE — Op Note (Signed)
NAME: Kyle Ponce, Kyle Ponce MEDICAL RECORD NO: [id number] ACCOUNT NO: [id number] DATE OF BIRTH: [birth year] FACILITY: [facility] LOCATION: [facility] PHYSICIAN: Almedia Balls. Ranell Patrick, MD  Operative Report   DATE OF PROCEDURE: 06/30/2022  PREOPERATIVE DIAGNOSIS:  Right shoulder end-stage arthritis.  POSTOPERATIVE DIAGNOSIS:  Right shoulder end-stage arthritis.  PROCEDURE PERFORMED:  Right reverse shoulder replacement using DePuy Delta Xtend prosthesis with no subscap repair.  ATTENDING SURGEON:  Almedia Balls. Ranell Patrick, MD  ASSISTANT:  Konrad Felix Dixon, [place], who was scrubbed during the entire procedure, and necessary for satisfactory completion of surgery.  ANESTHESIA:  General anesthesia was used plus interscalene block.  ESTIMATED BLOOD LOSS:  250 mL  FLUID REPLACEMENT:  1500 mL crystalloid.  COUNTS:  Instrument counts correct.  COMPLICATIONS:  No complications.  ANTIBIOTICS:  Perioperative antibiotics were given.  INDICATIONS:  The patient is a 52 year old male who presents with a history of worsening right shoulder pain and dysfunction secondary to progressive end-stage arthritis.  The patient has failed an extended period of conservative management over the  course of years with gradually worsening range of motion and function.  Due to the patient's profound stiffness, loss of function, we discussed options, recommending reverse shoulder placement as the best most reliable option for relieving pain and  restoring function to his shoulder.  I was quite concerned about the amount of retroversion for this patient on his glenoid, was such bad posterior glenoid erosion that the reverse would have a better chance of long-term success.  The patient agreed with  that and informed consent obtained.  DESCRIPTION OF PROCEDURE:  After an adequate level of anesthesia was achieved, the patient was positioned in modified beach chair position.  Right shoulder correctly identified and sterilely prepped and draped in  the usual manner.  Timeout was called,  verifying correct patient, correct site, we entered the patient's shoulder using a standard deltopectoral approach, starting at the coracoid process extending down to the anterior humerus.  Dissection down through subcutaneous tissues using Bovie.   Cephalic vein identified, taken laterally with the deltoid, pectoralis taken medially.  Conjoined tendon identified and retracted medially.  Deep retractors placed.  Biceps tenodesed in situ with 0 Vicryl figure-of-eight suture x2.  We then released the  subscapularis off the lesser tuberosity for potential repair.  We tagged it with #2 FiberWire suture in a modified Mason-Allen suture technique.  We then released the inferior capsule and exposed the large inferior humeral osteophytes, which were removed  with rongeur.  We then extended the shoulder delivering the humeral head out of the wound.  We did release the biceps tendon and the anterior rotator cuff including supraspinatus and a half of infraspinatus.  We entered the proximal humerus with a 6 mm  reamer.  Bone-on-bone arthritis was noted.  We reamed up to a size 12.  We then placed our 12 mm T-handle guide and resected the head at 20 degrees of retroversion with the oscillating saw, we removed excess osteophytes with the rongeur.  We then  subluxed the humerus posteriorly gaining okay exposure of the glenoid face due to the extreme retroversion.  It was very difficult exposure throughout the entire surgery we were careful to protect the axillary nerve.  Once we had our exposure, we went  ahead and found our center point for a guide pin.  It was difficult because of the amount of retroversion to recreate the appropriate version to the shoulder.  The initial reaming with full correction of the version ream down the highest  side somewhat I  felt like we just could not get enough bony preparation and appropriate seating for this, metaglene baseplate for the Delta Xtend  with the version fully corrected, so we went ahead and with more of the patient's current version so about 2/3rd correction  of that version and we were able to get appropriate bony preparation for the metaglene.  We then did our peripheral hand reaming to make sure we had clearance for the 42 glenosphere.  Next, we drilled the central peg hole.  We then impacted the HA coated  metaglene baseplate into position and had excellent purchase in the bone.  The screw lengths were all different than normal.  We were able to only get 24 superiorly, but very good purchase and then a 48 posteriorly, which went down the scapula and then  a 24 inferiorly locking that and then also an 18 anteriorly.  We felt like we had very good baseplate security.  We locked all of those screws within the baseplate.  We then selected a 42+0 glenosphere and attached that to the baseplate with a  screwdriver.  We next went to the humeral side, reamed for the 2 right metaphysis and then trialled with the 12 stem and the 2 right metaphysis set on the 0 setting and placed in 20 degrees of retroversion.  We reduced with a 42+3 poly.  I then went  ahead and assessed whether or not the subscap could be repaired basically with max releasing on the subscap it would only externally rotate maybe 10 degrees or 20 degrees.  I felt like it would be under too much tension or likely ruptured during the  postoperative recovery period and would not give him much in terms of added benefit.  Thus, we elected not to repair the subscap.  We resected the supraspinatus and anterior infraspinatus tendon and muscle such that would not take up space in the region  of the shoulder placement.  We irrigated and removed the trial components.  We then used available bone graft from the humeral head in impaction grafting technique with the HA coated press fit 12 stem and the 2 right metaphysis set on the 0 setting and  placed in 20 degrees of retroversion. With the  stem in place, we selected the real 42+3 poly impacted on the humeral tray and reduced the shoulder.  We had a really nice stable shoulder with excellent range of motion and resected the remnant of the  subscap and then repaired deltopectoral interval with 0 Vicryl suture followed by 2-0 Vicryl for subcutaneous closure and 4-0 Monocryl for skin.  Steri-Strips applied followed by sterile dressing.  The patient tolerated surgery well.   PUS D: 06/30/2022 11:57:09 am T: 06/30/2022 12:12:00 pm  JOB: [id number]/ [id number]

## 2022-06-30 NOTE — Anesthesia Postprocedure Evaluation (Signed)
Anesthesia Post Note  Patient: Kyle Ponce  Procedure(s) Performed: REVERSE SHOULDER ARTHROPLASTY (Right: Shoulder)     Patient location during evaluation: PACU Anesthesia Type: General Level of consciousness: awake and alert Pain management: pain level controlled Vital Signs Assessment: post-procedure vital signs reviewed and stable Respiratory status: spontaneous breathing, nonlabored ventilation and respiratory function stable Cardiovascular status: stable and blood pressure returned to baseline Anesthetic complications: no   No notable events documented.  Last Vitals:  Vitals:   06/30/22 1230 06/30/22 1248  BP: 124/72 120/74  Pulse: (!) 54 77  Resp: 13 16  Temp: (!) 36.4 C (!) 36.3 C  SpO2: 93% 94%    Last Pain:  Vitals:   06/30/22 1248  TempSrc: Oral  PainSc: 0-No pain                 Beryle Lathe

## 2022-06-30 NOTE — Transfer of Care (Signed)
Immediate Anesthesia Transfer of Care Note  Patient: Kyle Ponce  Procedure(s) Performed: REVERSE SHOULDER ARTHROPLASTY (Right: Shoulder)  Patient Location: PACU  Anesthesia Type:Regional and GA combined with regional for post-op pain  Level of Consciousness: awake, alert , oriented, and patient cooperative  Airway & Oxygen Therapy: Patient Spontanous Breathing and Patient connected to face mask oxygen  Post-op Assessment: Report given to RN and Post -op Vital signs reviewed and stable  Post vital signs: Reviewed and stable  Last Vitals:  Vitals Value Taken Time  BP 116/60 06/30/22 1153  Temp    Pulse 66 06/30/22 1155  Resp 14 06/30/22 1155  SpO2 99 % 06/30/22 1155  Vitals shown include unvalidated device data.  Last Pain:  Vitals:   06/30/22 0837  TempSrc:   PainSc: 0-No pain         Complications: No notable events documented.

## 2022-06-30 NOTE — Discharge Instructions (Signed)
Ice to the shoulder constantly.  Keep the incision covered and clean and dry for one week, then ok to get it wet in the shower.  Do exercise as instructed several times per day.  DO NOT reach behind your back or push up out of a chair with the operative arm.  Use a sling while you are up and around for comfort, may remove while seated.  Keep pillow propped behind the operative elbow.  Follow up with Dr Guilherme Schwenke in two weeks in the office, call [phone] for appt  Please call Dr Jakyrie Totherow (cell) at [phone] with any questions or concerns 

## 2022-06-30 NOTE — Evaluation (Signed)
Occupational Therapy Evaluation Patient Details Name: Kyle Ponce MRN: [id number] DOB: 05-Aug-1970 Today's Date: 06/30/2022   History of Present Illness Kyle Ponce is a 52 yr old male who is s/p a R reverse shoulder replacement on 06-30-22, due to end stage arthritis of the shoulder.   Clinical Impression   Patient is s/p shoulder replacement without functional use of right upper extremity, secondary to effects of surgery, interscalene block, and shoulder precautions. Therapist provided education and instruction to patient and his spouse with regards to ROM/exercises, post-op precautions, UE positioning, donning upper extremity clothing, recommendations for bathing while maintaining shoulder precautions, use of ice for pain and edema management, and correctly donning/doffing sling. Patient and spouse verbalized understanding and demonstrated as needed. Patient needed assistance to donn shirt, pants, and shoes, with instruction provided on compensatory strategies to perform ADLs. Patient to follow up with MD for further therapy needs.        Recommendations for follow up therapy are one component of a multi-disciplinary discharge planning process, led by the attending physician.  Recommendations may be updated based on patient status, additional functional criteria and insurance authorization.   Assistance Recommended at Discharge Intermittent Supervision/Assistance  Patient can return home with the following Help with stairs or ramp for entrance;Assist for transportation;Assistance with cooking/housework;A little help with bathing/dressing/bathroom    Functional Status Assessment  Patient has had a recent decline in their functional status and demonstrates the ability to make significant improvements in function in a reasonable and predictable amount of time.  Equipment Recommendations  None recommended by OT       Precautions / Restrictions Precautions Precautions: Shoulder Type of  Shoulder Precautions: RUE NWB, sling at all times except ADLs and exercise, okay to perform hand wrist, and elbow ROM, no PROM or AROM of the shoulder, okay for gentle ADLs and hand to face Shoulder Interventions: Shoulder sling/immobilizer Precaution Booklet Issued: Yes (comment) Required Braces or Orthoses: Sling Restrictions Weight Bearing Restrictions: Yes RUE Weight Bearing: Non weight bearing      Mobility   Transfers Overall transfer level: Needs assistance   Transfers: Sit to/from Stand Sit to Stand: Supervision                  Balance Overall balance assessment: No apparent balance deficits (not formally assessed)         ADL either performed or assessed with clinical judgement       Pertinent Vitals/Pain Pain Assessment Pain Assessment: No/denies pain     Hand Dominance  (Ambidextrous)      Communication Communication Communication: No difficulties   Cognition Arousal/Alertness: Awake/alert Behavior During Therapy: WFL for tasks assessed/performed Overall Cognitive Status: Within Functional Limits for tasks assessed                Shoulder Instructions Shoulder Instructions Donning/doffing shirt without moving shoulder: Caregiver independent with task Method for sponge bathing under operated UE: Caregiver independent with task Donning/doffing sling/immobilizer: Caregiver independent with task Correct positioning of sling/immobilizer: Caregiver independent with task ROM for elbow, wrist and digits of operated UE: Caregiver independent with task;Patient able to independently direct caregiver Sling wearing schedule (on at all times/off for ADL's): Caregiver independent with task Proper positioning of operated UE when showering: Caregiver independent with task Positioning of UE while sleeping: Caregiver independent with task    Home Living Family/patient expects to be discharged to:: Private residence Living Arrangements: Spouse/significant  other Available Help at Discharge: Family Type of Home: House Home Access: Stairs to  enter Entrance Stairs-Number of Steps: 4 Entrance Stairs-Rails: Left;Right Home Layout: One level     Bathroom Shower/Tub: Runner, broadcasting/film/video: Shower seat - built in          Prior Functioning/Environment Prior Level of Function : Independent/Modified Independent             Mobility Comments: He was independent with ambulation. ADLs Comments: He was independent with ADLs.        OT Problem List: Impaired UE functional use;Decreased range of motion      OT Treatment/Interventions:   N/A      OT Frequency:  N/A       AM-PAC OT "6 Clicks" Daily Activity     Outcome Measure Help from another person eating meals?: None Help from another person taking care of personal grooming?: None Help from another person toileting, which includes using toliet, bedpan, or urinal?: None Help from another person bathing (including washing, rinsing, drying)?: A Little Help from another person to put on and taking off regular upper body clothing?: A Little Help from another person to put on and taking off regular lower body clothing?: A Little 6 Click Score: 21   End of Session Equipment Utilized During Treatment:  (none) Nurse Communication: Other (comment) (shoulder education completed)  Activity Tolerance: Patient tolerated treatment well Patient left: in chair;with family/visitor present  OT Visit Diagnosis: Muscle weakness (generalized) (M62.81)                Time: 1610-9604 OT Time Calculation (min): 20 min Charges:  OT General Charges $OT Visit: 1 Visit OT Evaluation $OT Eval Low Complexity: 1 Low    Kato Wieczorek L Juanjesus Pepperman, OTR/L 06/30/2022, 2:57 PM

## 2022-06-30 NOTE — Brief Op Note (Signed)
06/30/2022  11:49 AM  PATIENT:  Kyle Ponce  52 y.o. male  PRE-OPERATIVE DIAGNOSIS:  right shoulder osteoarthritis, end stage  POST-OPERATIVE DIAGNOSIS:  right shoulder osteoarthritis, end stage  PROCEDURE:  Procedure(s) with comments: REVERSE SHOULDER ARTHROPLASTY (Right) - choice with interscalene block DePuy Delta Xtend with NO subscap repair  SURGEON:  Surgeon(s) and Role:    Beverely Low, MD - Primary  PHYSICIAN ASSISTANT:   ASSISTANTS: Thea Gist, PA-C   ANESTHESIA:   regional and general  EBL:  250 mL   BLOOD ADMINISTERED:none  DRAINS: none   LOCAL MEDICATIONS USED:  MARCAINE     SPECIMEN:  No Specimen  DISPOSITION OF SPECIMEN:  N/A  COUNTS:  YES  TOURNIQUET:  * No tourniquets in log *  DICTATION: .Other Dictation: Dictation Number [id number]  PLAN OF CARE: Discharge to home after PACU  PATIENT DISPOSITION:  PACU - hemodynamically stable.   Delay start of Pharmacological VTE agent (>24hrs) due to surgical blood loss or risk of bleeding: not applicable

## 2022-06-30 NOTE — Anesthesia Procedure Notes (Signed)
Procedure Name: Intubation Date/Time: 06/30/2022 9:51 AM  Performed by: Deri Fuelling, CRNAPre-anesthesia Checklist: Patient identified, Emergency Drugs available, Suction available and Patient being monitored Patient Re-evaluated:Patient Re-evaluated prior to induction Oxygen Delivery Method: Circle system utilized Preoxygenation: Pre-oxygenation with 100% oxygen Induction Type: IV induction Ventilation: Mask ventilation without difficulty Laryngoscope Size: Mac and 4 Grade View: Grade I Tube type: Oral Tube size: 7.5 mm Number of attempts: 1 Airway Equipment and Method: Stylet and Oral airway Placement Confirmation: ETT inserted through vocal cords under direct vision, positive ETCO2 and breath sounds checked- equal and bilateral Secured at: 22 cm Tube secured with: Tape Dental Injury: Teeth and Oropharynx as per pre-operative assessment

## 2022-06-30 NOTE — Interval H&P Note (Signed)
History and Physical Interval Note:  06/30/2022 7:28 AM  Kyle Ponce  has presented today for surgery, with the diagnosis of right shoulder osteoarthritis.  The various methods of treatment have been discussed with the patient and family. After consideration of risks, benefits and other options for treatment, the patient has consented to  Procedure(s) with comments: REVERSE SHOULDER ARTHROPLASTY (Right) - choice with interscalene block as a surgical intervention.  The patient's history has been reviewed, patient examined, no change in status, stable for surgery.  I have reviewed the patient's chart and labs.  Questions were answered to the patient's satisfaction.     Verlee Rossetti

## 2022-06-30 NOTE — Anesthesia Procedure Notes (Signed)
Anesthesia Regional Block: Interscalene brachial plexus block   Pre-Anesthetic Checklist: , timeout performed,  Correct Patient, Correct Site, Correct Laterality,  Correct Procedure, Correct Position, site marked,  Risks and benefits discussed,  Surgical consent,  Pre-op evaluation,  At surgeon's request and post-op pain management  Laterality: Right  Prep: chloraprep       Needles:  Injection technique: Single-shot  Needle Type: Echogenic Needle     Needle Length: 5cm  Needle Gauge: 21     Additional Needles:   Narrative:  Start time: 06/30/2022 8:22 AM End time: 06/30/2022 8:25 AM Injection made incrementally with aspirations every 5 mL.  Performed by: Personally  Anesthesiologist: Beryle Lathe, MD  Additional Notes: No pain on injection. No increased resistance to injection. Injection made in 5cc increments. Good needle visualization. Patient tolerated the procedure well.
